# Patient Record
Sex: Female | Born: 1955 | Race: White | Hispanic: No | Smoking: Never smoker
Health system: Southern US, Community
[De-identification: ages and names within clinical notes are randomized; demographics above are authoritative.]

## PROBLEM LIST (undated history)

## (undated) DIAGNOSIS — E785 Hyperlipidemia, unspecified: Secondary | ICD-10-CM

## (undated) DIAGNOSIS — R251 Tremor, unspecified: Secondary | ICD-10-CM

## (undated) DIAGNOSIS — M8580 Other specified disorders of bone density and structure, unspecified site: Secondary | ICD-10-CM

## (undated) DIAGNOSIS — G2581 Restless legs syndrome: Secondary | ICD-10-CM

## (undated) DIAGNOSIS — L409 Psoriasis, unspecified: Secondary | ICD-10-CM

## (undated) DIAGNOSIS — G629 Polyneuropathy, unspecified: Secondary | ICD-10-CM

## (undated) DIAGNOSIS — K5140 Inflammatory polyps of colon without complications: Secondary | ICD-10-CM

## (undated) HISTORY — DX: Other specified disorders of bone density and structure, unspecified site: M85.80

## (undated) HISTORY — PX: CYST EXCISION: SHX5701

## (undated) HISTORY — DX: Polyneuropathy, unspecified: G62.9

## (undated) HISTORY — DX: Psoriasis, unspecified: L40.9

## (undated) HISTORY — DX: Restless legs syndrome: G25.81

## (undated) HISTORY — DX: Hyperlipidemia, unspecified: E78.5

## (undated) HISTORY — PX: TONSILLECTOMY AND ADENOIDECTOMY: SHX28

## (undated) HISTORY — PX: CHOLECYSTECTOMY: SHX55

## (undated) HISTORY — DX: Inflammatory polyps of colon without complications: K51.40

## (undated) HISTORY — DX: Tremor, unspecified: R25.1

---

## 2012-10-28 DIAGNOSIS — G629 Polyneuropathy, unspecified: Secondary | ICD-10-CM | POA: Insufficient documentation

## 2013-05-07 ENCOUNTER — Ambulatory Visit (INDEPENDENT_AMBULATORY_CARE_PROVIDER_SITE_OTHER): Payer: BC Managed Care – PPO | Admitting: Family Medicine

## 2013-05-07 ENCOUNTER — Encounter: Payer: Self-pay | Admitting: Family Medicine

## 2013-05-07 VITALS — BP 120/80 | HR 84 | Temp 98.3°F | Ht 64.25 in | Wt 141.8 lb

## 2013-05-07 DIAGNOSIS — E785 Hyperlipidemia, unspecified: Secondary | ICD-10-CM

## 2013-05-07 DIAGNOSIS — L408 Other psoriasis: Secondary | ICD-10-CM

## 2013-05-07 DIAGNOSIS — M8580 Other specified disorders of bone density and structure, unspecified site: Secondary | ICD-10-CM

## 2013-05-07 DIAGNOSIS — L409 Psoriasis, unspecified: Secondary | ICD-10-CM

## 2013-05-07 DIAGNOSIS — M899 Disorder of bone, unspecified: Secondary | ICD-10-CM

## 2013-05-07 DIAGNOSIS — Z1239 Encounter for other screening for malignant neoplasm of breast: Secondary | ICD-10-CM

## 2013-05-07 DIAGNOSIS — G2581 Restless legs syndrome: Secondary | ICD-10-CM

## 2013-05-07 NOTE — Progress Notes (Signed)
Nature conservation officer at [facility] [address] [place] [address] Phone: [phone] Fax: [phone]  Date:  05/07/2013   Name:  Tami Warner   DOB:  Nov 04, 1955   MRN:  [id number] Gender: female Age: 57 y.o.  Primary Physician:  Hannah Beat, MD  Evaluating MD: Hannah Beat, MD   Chief Complaint: New Patient   History of Present Illness:  Tami Warner is a 57 y.o. 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  HLD, labs reviewed Total Chol in the 210-220 range. No meds now.  Psoriasis stable right now, 1 patch on head  Osteopenia, difficulty remembering to take her ca and vit d  Has had prn restless legs at times  Intermittent elavil use for insomnia  O/w has been healthy  Had some increased IOP req frequent Opth checks  Patient Active Problem List   Diagnosis Date Noted  . Hyperlipidemia   . Restless leg syndrome   . Psoriasis   . Osteopenia     Past Medical History  Diagnosis Date  . Hyperlipidemia   . Inflammatory polyps of colon without complications   . Tremor   . Restless leg syndrome   . Peripheral neuropathy   . Psoriasis   . Osteopenia     Past Surgical History  Procedure Laterality Date  . Cholecystectomy    . Tonsillectomy and adenoidectomy      History   Social History  . Marital Status: Single    Spouse Name: N/A    Number of Children: 3  . Years of Education: N/A   Occupational History  . toxicologist     [employer]   Social History Main Topics  . Smoking status: Never Smoker   . Smokeless tobacco: Never Used  . Alcohol Use: Yes     Comment: occ glass of wine  . Drug Use: No  . Sexual Activity: Not on file   Other Topics Concern  . Not on file   Social History Narrative  . No narrative on file    Family History  Problem Relation Age of Onset  . Hyperlipidemia Mother   . Hypertension Mother   . Arthritis Mother   . Gout Mother   .  Hyperlipidemia Father   . Hypertension Father   . Tremor Father   . COPD Sister   . Asthma Sister   . Hyperlipidemia Sister   . Tremor Sister   . Hyperlipidemia Brother   . Alcohol abuse Son   . Drug abuse Son   . Breast cancer Maternal Aunt   . Hyperlipidemia Sister     No Known Allergies  No current outpatient prescriptions on file prior to visit.   No current facility-administered medications on file prior to visit.     Review of Systems:   GEN: No acute illnesses, no fevers, chills. GI: No n/v/d, eating normally Pulm: No SOB Interactive and getting along well at home.  Otherwise, ROS is as per the HPI.   Physical Examination: BP 120/80  Pulse 84  Temp(Src) 98.3 F (36.8 C) (Oral)  Ht 5' 4.25" (1.632 m)  Wt 141 lb 12 oz (64.297 kg)  BMI 24.14 kg/m2  Ideal Body Weight: Weight in (lb) to have BMI = 25: 146.5   GEN: WDWN, NAD, Non-toxic, A & O x 3 HEENT: Atraumatic, Normocephalic. Neck supple. No masses, No LAD. Ears and Nose: No external deformity. CV: RRR, No M/G/R. No JVD. No thrill. No extra  heart sounds. PULM: CTA B, no wheezes, crackles, rhonchi. No retractions. No resp. distress. No accessory muscle use. EXTR: No c/c/e NEURO Normal gait.  PSYCH: Normally interactive. Conversant. Not depressed or anxious appearing.  Calm demeanor.    Assessment and Plan:  Encounter for breast cancer screening other than mammogram - Plan: MM Digital Screening  Hyperlipidemia  Restless leg syndrome  Psoriasis  Osteopenia  We will obtain records from the patient's prior physicians. Reviewed and all labs reviewed. With patient. Mildly low d and HLD.  Work on your diet: Try to eat minimal fatty food and eat more fruits and vegetables. Anything fried is bad, cream, beef and pork fat are bad.  Exercise is incredibly important to heart health. Try to exercise for at least 30 minutes 5 - 6 times a week   O/w doing well.  Orders Today:  Orders Placed This  Encounter  Procedures  . MM Digital Screening    Standing Status: Future     Number of Occurrences:      Standing Expiration Date: 07/07/2014    Order Specific Question:  Is the patient pregnant?    Answer:  No    Order Specific Question:  Preferred imaging location?    Answer:  [facility]    Order Specific Question:  Reason for exam:    Answer:  screening    Updated Medication List: (Includes new medications, updates to list, dose adjustments) No orders of the defined types were placed in this encounter.    Medications Discontinued: There are no discontinued medications.    Signed, Elpidio Galea. Alyanna Stoermer, MD 05/07/2013 2:20 PM

## 2013-05-07 NOTE — Patient Instructions (Addendum)
[facility]: Babs Sciara at [facility]  Opthalmology: Antony Contras at [facility] Dr. Dione Booze at [facility]  REFERRAL: GO THE THE FRONT ROOM AT THE ENTRANCE OF OUR CLINIC, NEAR CHECK IN. ASK FOR MARION. SHE WILL HELP YOU SET UP YOUR REFERRAL. DATE: TIME:

## 2013-05-08 DIAGNOSIS — L409 Psoriasis, unspecified: Secondary | ICD-10-CM | POA: Insufficient documentation

## 2013-05-08 DIAGNOSIS — M8580 Other specified disorders of bone density and structure, unspecified site: Secondary | ICD-10-CM | POA: Insufficient documentation

## 2013-05-08 DIAGNOSIS — G2581 Restless legs syndrome: Secondary | ICD-10-CM | POA: Insufficient documentation

## 2013-05-08 DIAGNOSIS — E785 Hyperlipidemia, unspecified: Secondary | ICD-10-CM | POA: Insufficient documentation

## 2013-05-29 ENCOUNTER — Ambulatory Visit
Admission: RE | Admit: 2013-05-29 | Discharge: 2013-05-29 | Disposition: A | Discharge status: Home / Self Care | Payer: BC Managed Care – PPO | Source: Ambulatory Visit | Attending: Family Medicine | Admitting: Family Medicine

## 2013-05-29 DIAGNOSIS — Z1239 Encounter for other screening for malignant neoplasm of breast: Secondary | ICD-10-CM

## 2013-11-29 ENCOUNTER — Ambulatory Visit (INDEPENDENT_AMBULATORY_CARE_PROVIDER_SITE_OTHER): Payer: BC Managed Care – PPO | Admitting: Internal Medicine

## 2013-11-29 ENCOUNTER — Encounter: Payer: Self-pay | Admitting: Internal Medicine

## 2013-11-29 VITALS — BP 120/76 | HR 74 | Temp 98.1°F | Wt 142.5 lb

## 2013-11-29 DIAGNOSIS — M25476 Effusion, unspecified foot: Secondary | ICD-10-CM

## 2013-11-29 DIAGNOSIS — M109 Gout, unspecified: Secondary | ICD-10-CM

## 2013-11-29 DIAGNOSIS — M25473 Effusion, unspecified ankle: Secondary | ICD-10-CM

## 2013-11-29 LAB — CBC
HEMATOCRIT: 42 % (ref 36.0–46.0)
HEMOGLOBIN: 14.3 g/dL (ref 12.0–15.0)
MCHC: 34 g/dL (ref 30.0–36.0)
MCV: 92.1 fl (ref 78.0–100.0)
Platelets: 251 10*3/uL (ref 150.0–400.0)
RBC: 4.56 Mil/uL (ref 3.87–5.11)
RDW: 11.9 % (ref 11.5–14.6)
WBC: 6.1 10*3/uL (ref 4.5–10.5)

## 2013-11-29 LAB — COMPREHENSIVE METABOLIC PANEL
ALK PHOS: 51 U/L (ref 39–117)
ALT: 25 U/L (ref 0–35)
AST: 19 U/L (ref 0–37)
Albumin: 4.3 g/dL (ref 3.5–5.2)
BILIRUBIN TOTAL: 0.8 mg/dL (ref 0.3–1.2)
BUN: 13 mg/dL (ref 6–23)
CHLORIDE: 103 meq/L (ref 96–112)
CO2: 31 mEq/L (ref 19–32)
CREATININE: 0.6 mg/dL (ref 0.4–1.2)
Calcium: 9.1 mg/dL (ref 8.4–10.5)
GFR: 108.37 mL/min (ref >60.00)
Glucose, Bld: 81 mg/dL (ref 70–99)
Potassium: 3.6 mEq/L (ref 3.5–5.1)
Sodium: 141 mEq/L (ref 135–145)
Total Protein: 7.2 g/dL (ref 6.0–8.3)

## 2013-11-29 LAB — URIC ACID: Uric Acid, Serum: 3.5 mg/dL (ref 2.4–7.0)

## 2013-11-29 NOTE — Progress Notes (Signed)
   Subjective:    Patient ID: Tami Warner, female    DOB: [birth year], 58 y.o.   MRN: [id number]  HPI  Pt presents to the clinic today with c/o pain and swelling in the ball of her foot. She noticed this. She reports it is also red, swollen, hot and tender to touch. She has tried OTC ibuprofen and ice compresses with some relief. She has no history of gout. She does occasionally drink wine, only a little red meat.  Review of Systems      Past Medical History  Diagnosis Date  . Hyperlipidemia   . Inflammatory polyps of colon without complications   . Tremor   . Restless leg syndrome   . Peripheral neuropathy   . Psoriasis   . Osteopenia     No current outpatient prescriptions on file.   No current facility-administered medications for this visit.    No Known Allergies  Family History  Problem Relation Age of Onset  . Hyperlipidemia Mother   . Hypertension Mother   . Arthritis Mother   . Gout Mother   . Hyperlipidemia Father   . Hypertension Father   . Tremor Father   . COPD Sister   . Asthma Sister   . Hyperlipidemia Sister   . Tremor Sister   . Hyperlipidemia Brother   . Alcohol abuse Son   . Drug abuse Son   . Breast cancer Maternal Aunt   . Hyperlipidemia Sister     History   Social History  . Marital Status: Single    Spouse Name: N/A    Number of Children: 3  . Years of Education: N/A   Occupational History  . toxicologist     [employer]   Social History Main Topics  . Smoking status: Never Smoker   . Smokeless tobacco: Never Used  . Alcohol Use: Yes     Comment: occ glass of wine  . Drug Use: No  . Sexual Activity: Not on file   Other Topics Concern  . Not on file   Social History Narrative  . No narrative on file     Constitutional: Denies fever, malaise, fatigue, headache or abrupt weight changes.  Musculoskeletal: Pt reports pain and swelling in the ball of her foot. Denies decrease in range of motion, difficulty with gait, muscle  pain.  Skin: Pt reports redness and warmth on ball of foot. Denies rashes, lesions or ulcercations.     No other specific complaints in a complete review of systems (except as listed in HPI above).  Objective:   Physical Exam  BP 120/76  Pulse 74  Temp(Src) 98.1 F (36.7 C) (Oral)  Wt 142 lb 8 oz (64.638 kg)  SpO2 98% Wt Readings from Last 3 Encounters:  11/29/13 142 lb 8 oz (64.638 kg)  05/07/13 141 lb 12 oz (64.297 kg)    General: Appears their stated age, well developed, well nourished in NAD. Skin: Warm, dry and intact. No rashes, lesions or ulcerations noted. Musculoskeletal: Swelling of PIP left big toe. Warm and tender to touch.          Assessment & Plan:   Gout:  Will check cbc, cmet and uric acid level today Continue Ibuprofen for now as flare seems to be improved  RTC as needed or if symptoms persist or worsen

## 2013-11-29 NOTE — Patient Instructions (Addendum)

## 2013-11-29 NOTE — Progress Notes (Signed)
Pre visit review using our clinic review tool, if applicable. No additional management support is needed unless otherwise documented below in the visit note. 

## 2013-12-03 ENCOUNTER — Encounter: Payer: Self-pay | Admitting: Family Medicine

## 2014-02-01 ENCOUNTER — Other Ambulatory Visit: Payer: Self-pay | Admitting: Family Medicine

## 2014-02-01 ENCOUNTER — Telehealth: Payer: Self-pay

## 2014-02-01 MED ORDER — AMITRIPTYLINE HCL 25 MG PO TABS: 25.0000 mg | ORAL_TABLET | Freq: Every evening | ORAL | Status: DC | PRN

## 2014-02-01 NOTE — Telephone Encounter (Signed)
Pt request amitriptyline 25 mg with instructions taking 1-2 tabs at hs prn; med given to pt by previous physician, Dr Manson PasseyBrown. This is the only med pt has taken for anxiety and restless leg and pt does not take very often. 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 Pt said she does not take a lot of meds but hopes can get amitriptyline. Pt said if she needs to come for appt she will.pt request cb.[facility].

## 2014-03-27 ENCOUNTER — Encounter: Payer: Self-pay | Admitting: Family Medicine

## 2014-03-27 ENCOUNTER — Ambulatory Visit (INDEPENDENT_AMBULATORY_CARE_PROVIDER_SITE_OTHER): Payer: BC Managed Care – PPO | Admitting: Family Medicine

## 2014-03-27 VITALS — BP 110/70 | HR 70 | Temp 97.9°F | Ht 64.25 in | Wt 143.5 lb

## 2014-03-27 DIAGNOSIS — H8110 Benign paroxysmal vertigo, unspecified ear: Secondary | ICD-10-CM

## 2014-03-27 DIAGNOSIS — H8111 Benign paroxysmal vertigo, right ear: Secondary | ICD-10-CM

## 2014-03-27 MED ORDER — MECLIZINE HCL 25 MG PO TABS
12.5000 mg | ORAL_TABLET | Freq: Three times a day (TID) | ORAL | Status: DC | PRN
Start: 2014-03-27 — End: 2014-08-13

## 2014-03-27 MED ORDER — DIAZEPAM 2 MG PO TABS: 2.0000 mg | ORAL_TABLET | Freq: Four times a day (QID) | ORAL | Status: DC | PRN

## 2014-03-27 NOTE — Progress Notes (Signed)
[address] [place] [address]                Phone: [phone]                  Fax: [phone] 03/27/2014  ID: Minette Manders   MRN: [id number]  DOB: [birth year]  Primary Physician:  Hannah Beat, MD  Chief Complaint: Dizziness and Nausea  Subjective:   History of Present Illness:  Tami Warner is a 58 y.o. very pleasant female patient who presents with the following:  A few years ago, had some vertigo. Thought was getting up to quickly. Trouble sitting this morning, beyond dizzy. World is spinning around her.  Worse with going to the right. Nausea+. No ha, trauma, or neuro sx  Phenergan x 1 and it helped some. Business trip this weekend.   Past Medical History, Surgical History, Social History, Family History, Problem List, Medications, and Allergies have been reviewed and updated if relevant.  Review of Systems:  GEN: No acute illnesses, no fevers, chills. GI: No n/v/d, eating normally Pulm: No SOB Interactive and getting along well at home.  Otherwise, ROS is as per the HPI.  Objective:   Physical Examination: BP 110/70  Pulse 70  Temp(Src) 97.9 F (36.6 C) (Oral)  Ht 5' 4.25" (1.632 m)  Wt 143 lb 8 oz (65.091 kg)  BMI 24.44 kg/m2   GEN: WDWN, NAD, Non-toxic, A & O x 3 HEENT: Atraumatic, Normocephalic. Neck supple. No masses, No LAD. Ears and Nose: No external deformity. CV: RRR, No M/G/R. No JVD. No thrill. No extra heart sounds. PULM: CTA B, no wheezes, crackles, rhonchi. No retractions. No resp. distress. No accessory muscle use. EXTR: No c/c/e NEURO Normal gait.  PSYCH: Normally interactive. Conversant. Not depressed or anxious appearing.  Calm demeanor.   Laboratory and Imaging Data:  Assessment & Plan:   BPPV (benign paroxysmal positional vertigo), right  Treat symptomatically. If the patient is not better performance, consideration of ENT involvement appropriate.  New Prescriptions   DIAZEPAM (VALIUM) 2 MG  TABLET    Take 1 tablet (2 mg total) by mouth every 6 (six) hours as needed (vertigo).   MECLIZINE (ANTIVERT) 25 MG TABLET    Take 0.5-1 tablets (12.5-25 mg total) by mouth 3 (three) times daily as needed for dizziness.   Patient Instructions  Epley Maneuver Self-Care WHAT IS THE EPLEY MANEUVER? The Epley maneuver is an exercise you can do to relieve symptoms of benign paroxysmal positional vertigo (BPPV). This condition is often just referred to as vertigo. BPPV is caused by the movement of tiny crystals (canaliths) inside your inner ear. The accumulation and movement of canaliths in your inner ear causes a sudden spinning sensation (vertigo) when you move your head to certain positions. Vertigo usually lasts about 30 seconds. BPPV usually occurs in just one ear. If you get vertigo when you lie on your left side, you probably have BPPV in your left ear. Your health care provider can tell you which ear is involved.  BPPV may be caused by a head injury. Many people older than 50 get BPPV for unknown reasons. If you have been diagnosed with BPPV, your health care provider may teach you how to do this maneuver. BPPV is not life threatening (benign) and usually goes away in time.  WHEN SHOULD I PERFORM THE EPLEY  MANEUVER? You can do this maneuver at home whenever you have symptoms of vertigo. You may do the Epley maneuver up to 3 times a day until your symptoms of vertigo go away. HOW SHOULD I DO THE EPLEY MANEUVER? 1. Sit on the edge of a bed or table with your back straight. Your legs should be extended or hanging over the edge of the bed or table.  2. Turn your head halfway toward the affected ear.  3. Lie backward quickly with your head turned until you are lying flat on your back. You may want to position a pillow under your shoulders.  4. Hold this position for 30 seconds. You may experience an attack of vertigo. This is normal. Hold this position until the vertigo stops. 5. Then turn your head  to the opposite direction until your unaffected ear is facing the floor.  6. Hold this position for 30 seconds. You may experience an attack of vertigo. This is normal. Hold this position until the vertigo stops. 7. Now turn your whole body to the same side as your head. Hold for another 30 seconds.  8. You can then sit back up. ARE THERE RISKS TO THIS MANEUVER? In some cases, you may have other symptoms (such as changes in your vision, weakness, or numbness). If you have these symptoms, stop doing the maneuver and call your health care provider. Even if doing these maneuvers relieves your vertigo, you may still have dizziness. Dizziness is the sensation of light-headedness but without the sensation of movement. Even though the Epley maneuver may relieve your vertigo, it is possible that your symptoms will return within 5 years. WHAT SHOULD I DO AFTER THIS MANEUVER? After doing the Epley maneuver, you can return to your normal activities. Ask your doctor if there is anything you should do at home to prevent vertigo. This may include:  Sleeping with two or more pillows to keep your head elevated.  Not sleeping on the side of your affected ear.  Getting up slowly from bed.  Avoiding sudden movements during the day.  Avoiding extreme head movement, like looking up or bending over.  Wearing a cervical collar to prevent sudden head movements. WHAT SHOULD I DO IF MY SYMPTOMS GET WORSE? Call your health care provider if your vertigo gets worse. Call your provider right way if you have other symptoms, including:   Nausea.  Vomiting.  Headache.  Weakness.  Numbness.  Vision changes. Document Released: 08/14/2013 Document Reviewed: 08/14/2013 [facility] Patient Information 2015 [place]Care, [place]. This information is not intended to replace advice given to you by your health care provider. Make sure you discuss any questions you have with your health care provider.      Signed,  Elpidio GaleaSpencer  T. Keysha Damewood, MD, CAQ Sports Medicine  Current Medications at Discharge:   Medication List       This list is accurate as of: 03/27/14  1:33 PM.  Always use your most recent med list.               amitriptyline 25 MG tablet  Commonly known as:  ELAVIL  Take 1-2 tablets (25-50 mg total) by mouth at bedtime as needed for sleep.     diazepam 2 MG tablet  Commonly known as:  VALIUM  Take 1 tablet (2 mg total) by mouth every 6 (six) hours as needed (vertigo).     meclizine 25 MG tablet  Commonly known as:  ANTIVERT  Take 0.5-1 tablets (12.5-25 mg total) by  mouth 3 (three) times daily as needed for dizziness.

## 2014-03-27 NOTE — Patient Instructions (Signed)
Epley Maneuver Self-Care WHAT IS THE EPLEY MANEUVER? The Epley maneuver is an exercise you can do to relieve symptoms of benign paroxysmal positional vertigo (BPPV). This condition is often just referred to as vertigo. BPPV is caused by the movement of tiny crystals (canaliths) inside your inner ear. The accumulation and movement of canaliths in your inner ear causes a sudden spinning sensation (vertigo) when you move your head to certain positions. Vertigo usually lasts about 30 seconds. BPPV usually occurs in just one ear. If you get vertigo when you lie on your left side, you probably have BPPV in your left ear. Your health care provider can tell you which ear is involved.  BPPV may be caused by a head injury. Many people older than 50 get BPPV for unknown reasons. If you have been diagnosed with BPPV, your health care provider may teach you how to do this maneuver. BPPV is not life threatening (benign) and usually goes away in time.  WHEN SHOULD I PERFORM THE EPLEY MANEUVER? You can do this maneuver at home whenever you have symptoms of vertigo. You may do the Epley maneuver up to 3 times a day until your symptoms of vertigo go away. HOW SHOULD I DO THE EPLEY MANEUVER? 1. Sit on the edge of a bed or table with your back straight. Your legs should be extended or hanging over the edge of the bed or table.  2. Turn your head halfway toward the affected ear.  3. Lie backward quickly with your head turned until you are lying flat on your back. You may want to position a pillow under your shoulders.  4. Hold this position for 30 seconds. You may experience an attack of vertigo. This is normal. Hold this position until the vertigo stops. 5. Then turn your head to the opposite direction until your unaffected ear is facing the floor.  6. Hold this position for 30 seconds. You may experience an attack of vertigo. This is normal. Hold this position until the vertigo stops. 7. Now turn your whole body to  the same side as your head. Hold for another 30 seconds.  8. You can then sit back up. ARE THERE RISKS TO THIS MANEUVER? In some cases, you may have other symptoms (such as changes in your vision, weakness, or numbness). If you have these symptoms, stop doing the maneuver and call your health care provider. Even if doing these maneuvers relieves your vertigo, you may still have dizziness. Dizziness is the sensation of light-headedness but without the sensation of movement. Even though the Epley maneuver may relieve your vertigo, it is possible that your symptoms will return within 5 years. WHAT SHOULD I DO AFTER THIS MANEUVER? After doing the Epley maneuver, you can return to your normal activities. Ask your doctor if there is anything you should do at home to prevent vertigo. This may include:  Sleeping with two or more pillows to keep your head elevated.  Not sleeping on the side of your affected ear.  Getting up slowly from bed.  Avoiding sudden movements during the day.  Avoiding extreme head movement, like looking up or bending over.  Wearing a cervical collar to prevent sudden head movements. WHAT SHOULD I DO IF MY SYMPTOMS GET WORSE? Call your health care provider if your vertigo gets worse. Call your provider right way if you have other symptoms, including:   Nausea.  Vomiting.  Headache.  Weakness.  Numbness.  Vision changes. Document Released: 08/14/2013 Document Reviewed: 08/14/2013 ExitCare   Patient Information 2015 ExitCare, LLC. This information is not intended to replace advice given to you by your health care provider. Make sure you discuss any questions you have with your health care provider.  

## 2014-03-27 NOTE — Progress Notes (Signed)
Pre visit review using our clinic review tool, if applicable. No additional management support is needed unless otherwise documented below in the visit note. 

## 2014-03-28 ENCOUNTER — Telehealth: Payer: Self-pay

## 2014-03-28 NOTE — Telephone Encounter (Signed)
Pt request copy of letter for work done on 03/27/14. Advised pt copy of letter at front desk for pick up.

## 2014-08-12 ENCOUNTER — Telehealth: Payer: Self-pay | Admitting: Family Medicine

## 2014-08-12 NOTE — Telephone Encounter (Signed)
Pt came in today to request to be seen today. We did not have any visits available and the other offices were checked, but no appts available. Pt was advised that if she began having a lot of pain, to go to urgent care and she agreed. Otherwise she has an appt tomorrow @ 1:45pm

## 2014-08-13 ENCOUNTER — Encounter: Payer: Self-pay | Admitting: Family Medicine

## 2014-08-13 ENCOUNTER — Ambulatory Visit (INDEPENDENT_AMBULATORY_CARE_PROVIDER_SITE_OTHER): Payer: BC Managed Care – PPO | Admitting: Family Medicine

## 2014-08-13 VITALS — BP 120/80 | HR 75 | Temp 98.3°F | Ht 64.25 in | Wt 147.2 lb

## 2014-08-13 DIAGNOSIS — R3915 Urgency of urination: Secondary | ICD-10-CM

## 2014-08-13 DIAGNOSIS — N3001 Acute cystitis with hematuria: Secondary | ICD-10-CM

## 2014-08-13 DIAGNOSIS — N390 Urinary tract infection, site not specified: Secondary | ICD-10-CM | POA: Insufficient documentation

## 2014-08-13 LAB — POCT UA - MICROSCOPIC ONLY

## 2014-08-13 LAB — POCT URINALYSIS DIPSTICK
Bilirubin, UA: NEGATIVE
Glucose, UA: NEGATIVE
Ketones, UA: NEGATIVE
NITRITE UA: NEGATIVE
PH UA: 7
SPEC GRAV UA: 1.02
UROBILINOGEN UA: 0.2

## 2014-08-13 MED ORDER — SULFAMETHOXAZOLE-TRIMETHOPRIM 800-160 MG PO TABS: 1.0000 | ORAL_TABLET | Freq: Two times a day (BID) | ORAL | Status: DC

## 2014-08-13 NOTE — Assessment & Plan Note (Signed)
Uncomplicated. Send urine for culture.  treat with sulfa tmp twice daily x 3 days.

## 2014-08-13 NOTE — Addendum Note (Signed)
Addended by: Damita LackLORING, Bona Hubbard S on: 08/13/2014 02:36 PM   Modules accepted: Orders

## 2014-08-13 NOTE — Patient Instructions (Signed)
Complete antibiotics, push fluids.  Call if not improving as expected.   Urinary Tract Infection Urinary tract infections (UTIs) can develop anywhere along your urinary tract. Your urinary tract is your body's drainage system for removing wastes and extra water. Your urinary tract includes two kidneys, two ureters, a bladder, and a urethra. Your kidneys are a pair of bean-shaped organs. Each kidney is about the size of your fist. They are located below your ribs, one on each side of your spine. CAUSES Infections are caused by microbes, which are microscopic organisms, including fungi, viruses, and bacteria. These organisms are so small that they can only be seen through a microscope. Bacteria are the microbes that most commonly cause UTIs. SYMPTOMS  Symptoms of UTIs may vary by age and gender of the patient and by the location of the infection. Symptoms in young women typically include a frequent and intense urge to urinate and a painful, burning feeling in the bladder or urethra during urination. Older women and men are more likely to be tired, shaky, and weak and have muscle aches and abdominal pain. A fever may mean the infection is in your kidneys. Other symptoms of a kidney infection include pain in your back or sides below the ribs, nausea, and vomiting. DIAGNOSIS To diagnose a UTI, your caregiver will ask you about your symptoms. Your caregiver also will ask to provide a urine sample. The urine sample will be tested for bacteria and white blood cells. White blood cells are made by your body to help fight infection. TREATMENT  Typically, UTIs can be treated with medication. Because most UTIs are caused by a bacterial infection, they usually can be treated with the use of antibiotics. The choice of antibiotic and length of treatment depend on your symptoms and the type of bacteria causing your infection. HOME CARE INSTRUCTIONS  If you were prescribed antibiotics, take them exactly as your  caregiver instructs you. Finish the medication even if you feel better after you have only taken some of the medication.  Drink enough water and fluids to keep your urine clear or pale yellow.  Avoid caffeine, tea, and carbonated beverages. They tend to irritate your bladder.  Empty your bladder often. Avoid holding urine for long periods of time.  Empty your bladder before and after sexual intercourse.  After a bowel movement, women should cleanse from front to back. Use each tissue only once. SEEK MEDICAL CARE IF:   You have back pain.  You develop a fever.  Your symptoms do not begin to resolve within 3 days. SEEK IMMEDIATE MEDICAL CARE IF:   You have severe back pain or lower abdominal pain.  You develop chills.  You have nausea or vomiting.  You have continued burning or discomfort with urination. MAKE SURE YOU:   Understand these instructions.  Will watch your condition.  Will get help right away if you are not doing well or get worse. Document Released: 05/19/2005 Document Revised: 02/08/2012 Document Reviewed: 09/17/2011 [facility]ExitCare Patient Information 2015 [place]Care, [place]LLC. This information is not intended to replace advice given to you by your health care provider. Make sure you discuss any questions you have with your health care provider.

## 2014-08-13 NOTE — Progress Notes (Signed)
Pre visit review using our clinic review tool, if applicable. No additional management support is needed unless otherwise documented below in the visit note. 

## 2014-08-13 NOTE — Progress Notes (Signed)
   Subjective:    Patient ID: Tami Warner, female    DOB: 1955/08/31, 58 y.o.   MRN: [id number]  HPI  58 year old  Post menopausal female pt of Dr. Cyndie Chimeopland's reports  5 days of lower abdominal achyness, pale pink urine.  Spotting of blood on toilet tissues.  She has noted urinary urgency, frequency,  sharp spasm of pain at end of urine stream.  No vaginal discharge or itching.   No fever, no flank pain.  No history recurrent UTIs, no recent uro procedures, has two kidneys  No past pyelo.     Review of Systems  Constitutional: Negative for fever and fatigue.  HENT: Negative for ear pain.   Eyes: Negative for pain.  Respiratory: Negative for chest tightness and shortness of breath.   Cardiovascular: Negative for chest pain, palpitations and leg swelling.  Gastrointestinal: Negative for abdominal pain.  Genitourinary: Negative for dysuria.       Objective:   Physical Exam  Constitutional: Vital signs are normal. She appears well-developed and well-nourished. She is cooperative.  Non-toxic appearance. She does not appear ill. No distress.  HENT:  Head: Normocephalic.  Right Ear: Hearing, tympanic membrane, external ear and ear canal normal. Tympanic membrane is not erythematous, not retracted and not bulging.  Left Ear: Hearing, tympanic membrane, external ear and ear canal normal. Tympanic membrane is not erythematous, not retracted and not bulging.  Nose: No mucosal edema or rhinorrhea. Right sinus exhibits no maxillary sinus tenderness and no frontal sinus tenderness. Left sinus exhibits no maxillary sinus tenderness and no frontal sinus tenderness.  Mouth/Throat: Uvula is midline, oropharynx is clear and moist and mucous membranes are normal.  Eyes: Conjunctivae, EOM and lids are normal. Pupils are equal, round, and reactive to light. Lids are everted and swept, no foreign bodies found.  Neck: Trachea normal and normal range of motion. Neck supple. Carotid bruit is not  present. No thyroid mass and no thyromegaly present.  Cardiovascular: Normal rate, regular rhythm, S1 normal, S2 normal, normal heart sounds, intact distal pulses and normal pulses.  Exam reveals no gallop and no friction rub.   No murmur heard. Pulmonary/Chest: Effort normal and breath sounds normal. No tachypnea. No respiratory distress. She has no decreased breath sounds. She has no wheezes. She has no rhonchi. She has no rales.  Abdominal: Soft. Normal appearance and bowel sounds are normal. There is tenderness in the suprapubic area. There is no rigidity, no guarding and no CVA tenderness.  Neurological: She is alert.  Skin: Skin is warm, dry and intact. No rash noted.  Psychiatric: Her speech is normal and behavior is normal. Judgment and thought content normal. Her mood appears not anxious. Cognition and memory are normal. She does not exhibit a depressed mood.          Assessment & Plan:

## 2014-08-15 LAB — URINE CULTURE

## 2014-08-19 ENCOUNTER — Telehealth: Payer: Self-pay | Admitting: Family Medicine

## 2014-08-19 MED ORDER — CIPROFLOXACIN HCL 250 MG PO TABS: 250.0000 mg | ORAL_TABLET | Freq: Two times a day (BID) | ORAL | Status: DC

## 2014-08-19 NOTE — Telephone Encounter (Signed)
Notify pt that the bacteria in her urine has returned as Ecoli RESISTANT to the antibiotic she is on. I will send in a course of copiro for her to complete.

## 2014-08-19 NOTE — Telephone Encounter (Signed)
Patient notified and verbalized understanding. 

## 2019-05-08 ENCOUNTER — Telehealth: Payer: Self-pay | Admitting: Family Medicine

## 2019-05-08 NOTE — Telephone Encounter (Signed)
I called and left message on patient voicemail to cal office and reschedule appointment scheduled for 05/09/2019, Dr. Loletha Grayer is not in the office on 05/08/2019 and appointment needs to be rescheduled.

## 2019-05-09 ENCOUNTER — Ambulatory Visit: Payer: Self-pay | Admitting: Family Medicine

## 2019-05-17 ENCOUNTER — Encounter: Payer: Self-pay | Admitting: Family Medicine

## 2019-05-17 ENCOUNTER — Ambulatory Visit (INDEPENDENT_AMBULATORY_CARE_PROVIDER_SITE_OTHER): Payer: Medicare Other | Admitting: Family Medicine

## 2019-05-17 ENCOUNTER — Other Ambulatory Visit: Payer: Self-pay

## 2019-05-17 VITALS — BP 140/84 | HR 74 | Temp 98.2°F | Ht 64.25 in | Wt 148.6 lb

## 2019-05-17 DIAGNOSIS — Z23 Encounter for immunization: Secondary | ICD-10-CM

## 2019-05-17 DIAGNOSIS — F4321 Adjustment disorder with depressed mood: Secondary | ICD-10-CM

## 2019-05-17 DIAGNOSIS — Z7689 Persons encountering health services in other specified circumstances: Secondary | ICD-10-CM | POA: Diagnosis not present

## 2019-05-17 DIAGNOSIS — F418 Other specified anxiety disorders: Secondary | ICD-10-CM

## 2019-05-17 MED ORDER — SERTRALINE HCL 50 MG PO TABS: 50.0000 mg | ORAL_TABLET | Freq: Every day | ORAL | 3 refills | Status: DC

## 2019-05-17 MED ORDER — ALPRAZOLAM 0.25 MG PO TABS: 0.2500 mg | ORAL_TABLET | Freq: Two times a day (BID) | ORAL | 0 refills | Status: DC | PRN

## 2019-05-17 NOTE — Progress Notes (Signed)
Tami Warner is a 63 y.o. female  Chief Complaint  Patient presents with  . Establish Care    est care/ anxiety & depression consult/ wantsflu and pneumonia    HPI: Tami Warner is a 63 y.o. female here to establish care with our office. She has 3 grown children.  She would like flu vaccine and would also like pneumonia vaccine. She would like to talk about situational depression and trouble sleeping. Her mother is in end stage renal failure with GFR = 8. Mother is not on dialysis. Pt was told mother will likely pass away in the next 2 weeks. Pt will be able to visit her mother tomorrow along with pts brother and sister. Her father has advanced dementia and is also in ALF.  Pt is having trouble falling and staying asleep. She is snacking a lot. She does not feel motivated to do anything. She is showering, dressing, etc.  Depression screen PHQ 2/9 05/17/2019  Decreased Interest 2  Down, Depressed, Hopeless 1  PHQ - 2 Score 3  Altered sleeping 1  Tired, decreased energy 2  Change in appetite 2  Feeling bad or failure about yourself  0  Trouble concentrating 0  Moving slowly or fidgety/restless 0  Suicidal thoughts 0  PHQ-9 Score 8    GAD 7 : Generalized Anxiety Score 05/17/2019  Nervous, Anxious, on Edge 2  Control/stop worrying 3  Worry too much - different things 1  Trouble relaxing 0  Restless 0  Easily annoyed or irritable 0  Afraid - awful might happen 1  Total GAD 7 Score 7    Last PAP: 2019 - normal Last mammo: 05/2018 Last Dexa: unsure - dx of osteopenia Last colonoscopy: 5 years ago  Past Medical History:  Diagnosis Date  . Hyperlipidemia   . Inflammatory polyps of colon without complications (HCC)   . Osteopenia   . Peripheral neuropathy   . Psoriasis   . Restless leg syndrome   . Tremor     Past Surgical History:  Procedure Laterality Date  . CHOLECYSTECTOMY    . CYST EXCISION    . TONSILLECTOMY AND ADENOIDECTOMY      Social History    Socioeconomic History  . Marital status: Single    Spouse name: Not on file  . Number of children: 3  . Years of education: Not on file  . Highest education level: Not on file  Occupational History  . Occupation: toxicologist    Comment: [employer]  Social Needs  . Financial resource strain: Not on file  . Food insecurity    Worry: Not on file    Inability: Not on file  . Transportation needs    Medical: Not on file    Non-medical: Not on file  Tobacco Use  . Smoking status: Never Smoker  . Smokeless tobacco: Never Used  Substance and Sexual Activity  . Alcohol use: Yes    Comment: occ glass of wine  . Drug use: No  . Sexual activity: Not on file  Lifestyle  . Physical activity    Days per week: Not on file    Minutes per session: Not on file  . Stress: Not on file  Relationships  . Social Musician on phone: Not on file    Gets together: Not on file    Attends religious service: Not on file    Active member of club or organization: Not on file    Attends meetings of clubs  or organizations: Not on file    Relationship status: Not on file  . Intimate partner violence    Fear of current or ex partner: Not on file    Emotionally abused: Not on file    Physically abused: Not on file    Forced sexual activity: Not on file  Other Topics Concern  . Not on file  Social History Narrative  . Not on file    Family History  Problem Relation Age of Onset  . Hyperlipidemia Mother   . Hypertension Mother   . Arthritis Mother   . Gout Mother   . Hyperlipidemia Father   . Hypertension Father   . Tremor Father   . COPD Sister   . Asthma Sister   . Hyperlipidemia Sister   . Tremor Sister   . Hyperlipidemia Brother   . Alcohol abuse Son   . Drug abuse Son   . Breast cancer Maternal Aunt   . Hyperlipidemia Sister      Immunization History  Administered Date(s) Administered  . Fluad Quad(high Dose 65+) 05/17/2019  . Influenza-Unspecified 06/08/2012  .  Pneumococcal Conjugate-13 05/17/2019  . Tdap 09/06/2008  . Zoster 12/22/2011    Outpatient Encounter Medications as of 05/17/2019  Medication Sig Note  . Cholecalciferol (VITAMIN D-3) 25 MCG (1000 UT) CAPS Take 2,000 Units by mouth.   . fluticasone (CUTIVATE) 0.05 % cream  08/13/2014: Received from: External Pharmacy  . Multiple Vitamin (MULTIVITAMIN) tablet Take 1 tablet by mouth daily.   . Omega-3 Fatty Acids (FISH OIL) 1000 MG CAPS Take by mouth.   . Probiotic Product (PROBIOTIC-10 PO) Take by mouth.   . ALPRAZolam (XANAX) 0.25 MG tablet Take 1 tablet (0.25 mg total) by mouth 2 (two) times daily as needed for anxiety.   . sertraline (ZOLOFT) 50 MG tablet Take 1 tablet (50 mg total) by mouth daily.   . [DISCONTINUED] amitriptyline (ELAVIL) 25 MG tablet Take 1-2 tablets (25-50 mg total) by mouth at bedtime as needed for sleep. (Patient not taking: Reported on 05/17/2019)   . [DISCONTINUED] ciprofloxacin (CIPRO) 250 MG tablet Take 1 tablet (250 mg total) by mouth 2 (two) times daily.   . [DISCONTINUED] clobetasol cream (TEMOVATE) 0.05 %  08/13/2014: Received from: External Pharmacy   No facility-administered encounter medications on file as of 05/17/2019.      ROS: Gen: no fever, chills  Skin: no rash, itching ENT: no ear pain, ear drainage, nasal congestion, rhinorrhea, sinus pressure, sore throat Eyes: no blurry vision, double vision Resp: no cough, wheeze,SOB CV: no CP, palpitations, LE edema,  GI: no heartburn, n/v/d/c, abd pain GU: no dysuria, urgency, frequency, hematuria  MSK: no joint pain, myalgias, back pain Neuro: no dizziness, headache, weakness, vertigo Psych: as above   No Known Allergies  BP 140/84   Pulse 74   Temp 98.2 F (36.8 C) (Oral)   Ht 5' 4.25" (1.632 m)   Wt 148 lb 9.6 oz (67.4 kg)   SpO2 96%   BMI 25.31 kg/m   BP Readings from Last 3 Encounters:  05/17/19 140/84  08/13/14 120/80  03/27/14 110/70    Physical Exam  Constitutional: She is  oriented to person, place, and time. She appears well-developed and well-nourished. No distress.  Cardiovascular: Normal rate, regular rhythm and normal heart sounds.  Pulmonary/Chest: Effort normal and breath sounds normal. No respiratory distress.  Neurological: She is alert and oriented to person, place, and time.  Psychiatric: She has a normal mood and affect. Her  behavior is normal.     A/P:  1. Encounter to establish care with new doctor  2. Need for influenza vaccination - Flu Vaccine QUAD High Dose(Fluad)  3. Need for pneumococcal vaccination - Pneumococcal conjugate vaccine 13-valent IM - PCV 23 in 1 year 04/2020  4. Situational depression 5. Situational anxiety - mother is actively dying and father is also in ALF with advanced dementia - pt is parents' POA and takes care of finances for them - PHQ-9 = 8, GAD-7 = 7 Rx: - ALPRAZolam (XANAX) 0.25 MG tablet; Take 1 tablet (0.25 mg total) by mouth 2 (two) times daily as needed for anxiety.  Dispense: 30 tablet; Refill: 0 - sertraline (ZOLOFT) 50 MG tablet; Take 1 tablet (50 mg total) by mouth daily.  Dispense: 90 tablet; Refill: 3 - f/u in 4 wks or sooner PRN

## 2019-06-13 ENCOUNTER — Telehealth: Payer: Self-pay

## 2019-06-13 NOTE — Telephone Encounter (Signed)
Questions for Screening COVID-19  Symptom onset: N/a  Travel or Contacts: No  During this illness, did/does the patient experience any of the following symptoms? Fever >100.4F []  Yes [x]  No []  Unknown Subjective fever (felt feverish) []  Yes [x]  No []  Unknown Chills []  Yes [x]  No []  Unknown Muscle aches (myalgia) []  Yes [x]  No []  Unknown Runny nose (rhinorrhea) []  Yes [x]  No []  Unknown Sore throat []  Yes [x]  No []  Unknown Cough (new onset or worsening of chronic cough) []  Yes [x]  No []  Unknown Shortness of breath (dyspnea) []  Yes [x]  No []  Unknown Nausea or vomiting []  Yes [x]  No []  Unknown Headache []  Yes [x]  No []  Unknown Abdominal pain  []  Yes [x]  No []  Unknown Diarrhea (?3 loose/looser than normal stools/24hr period) []  Yes [x]  No []  Unknown  

## 2019-06-14 ENCOUNTER — Encounter: Payer: Self-pay | Admitting: Family Medicine

## 2019-06-14 ENCOUNTER — Ambulatory Visit (INDEPENDENT_AMBULATORY_CARE_PROVIDER_SITE_OTHER): Payer: Medicare Other | Admitting: Family Medicine

## 2019-06-14 ENCOUNTER — Other Ambulatory Visit: Payer: Self-pay

## 2019-06-14 VITALS — BP 140/90 | HR 65 | Temp 98.0°F | Ht 64.0 in | Wt 148.6 lb

## 2019-06-14 DIAGNOSIS — Z1231 Encounter for screening mammogram for malignant neoplasm of breast: Secondary | ICD-10-CM | POA: Diagnosis not present

## 2019-06-14 DIAGNOSIS — Z8349 Family history of other endocrine, nutritional and metabolic diseases: Secondary | ICD-10-CM | POA: Diagnosis not present

## 2019-06-14 DIAGNOSIS — Z23 Encounter for immunization: Secondary | ICD-10-CM

## 2019-06-14 DIAGNOSIS — F4321 Adjustment disorder with depressed mood: Secondary | ICD-10-CM | POA: Diagnosis not present

## 2019-06-14 DIAGNOSIS — Z0000 Encounter for general adult medical examination without abnormal findings: Secondary | ICD-10-CM

## 2019-06-14 DIAGNOSIS — Z13820 Encounter for screening for osteoporosis: Secondary | ICD-10-CM

## 2019-06-14 DIAGNOSIS — F418 Other specified anxiety disorders: Secondary | ICD-10-CM | POA: Diagnosis not present

## 2019-06-14 LAB — ALT: ALT: 22 U/L (ref 0–35)

## 2019-06-14 LAB — BASIC METABOLIC PANEL
BUN: 13 mg/dL (ref 6–23)
CO2: 29 mEq/L (ref 19–32)
Calcium: 8.7 mg/dL (ref 8.4–10.5)
Chloride: 106 mEq/L (ref 96–112)
Creatinine, Ser: 0.52 mg/dL (ref 0.40–1.20)
GFR: 118.13 mL/min (ref >60.00)
Glucose, Bld: 98 mg/dL (ref 70–99)
Potassium: 4.1 mEq/L (ref 3.5–5.1)
Sodium: 140 mEq/L (ref 135–145)

## 2019-06-14 LAB — LIPID PANEL
Cholesterol: 239 mg/dL — ABNORMAL HIGH (ref 0–200)
HDL: 60.1 mg/dL (ref >39.00)
NonHDL: 179.3
Total CHOL/HDL Ratio: 4
Triglycerides: 213 mg/dL — ABNORMAL HIGH (ref 0.0–149.0)
VLDL: 42.6 mg/dL — ABNORMAL HIGH (ref 0.0–40.0)

## 2019-06-14 LAB — TSH: TSH: 1.49 u[IU]/mL (ref 0.35–4.50)

## 2019-06-14 LAB — LDL CHOLESTEROL, DIRECT: Direct LDL: 127 mg/dL

## 2019-06-14 LAB — AST: AST: 16 U/L (ref 0–37)

## 2019-06-14 LAB — VITAMIN D 25 HYDROXY (VIT D DEFICIENCY, FRACTURES): VITD: 39.4 ng/mL (ref 30.00–100.00)

## 2019-06-14 NOTE — Patient Instructions (Signed)

## 2019-06-14 NOTE — Progress Notes (Signed)
Chief Complaint  Patient presents with  . Follow-up    Blood work ??? follow up about depression and anxiety. Pt said she is feeling much better.    HPI: Tami Warner is a 63 y.o. female here to f/u on her anxiety and depressed mood in relation to her mother's chronic illness and decline. Patient's mother is in a SNF on what sounds like palliative care.  At [facility] on 05/17/19, pt was started on zoloft 50mg  daily and given Rx for xanax 0.25mg  BID PRN. Pt states she took the zoloft for about 1 week and felt it was helpful. She took 1 tab of xanax but felt she had a panic attack and was not able to sleep one night. She has not taken either med since that time (about 2 wks ago).   Today, pt states she is doing "much better". Pt states she's had a "mental reboot". She is trying not to "count down" her mother's "days left". She is sleeping, eating. She would like to restart the zoloft.   She is due for CPE and is fasting today so would like to get labs done. She is due for mammo and dexa. She would like shingles vaccine but not today as she is concerned about possible side effect of fever and not being able to see her mother in SNF. Flu and pneumonia vaccines UTD. Colonoscopy and PAP UTD.  Depression screen [facility] 2/9 05/17/2019  Decreased Interest 2  Down, Depressed, Hopeless 1  PHQ - 2 Score 3  Altered sleeping 1  Tired, decreased energy 2  Change in appetite 2  Feeling bad or failure about yourself  0  Trouble concentrating 0  Moving slowly or fidgety/restless 0  Suicidal thoughts 0  PHQ-9 Score 8    GAD 7 : Generalized Anxiety Score 05/17/2019  Nervous, Anxious, on Edge 2  Control/stop worrying 3  Worry too much - different things 1  Trouble relaxing 0  Restless 0  Easily annoyed or irritable 0  Afraid - awful might happen 1  Total GAD 7 Score 7      Past Medical History:  Diagnosis Date  . Hyperlipidemia   . Inflammatory polyps of colon without complications ([place])   . Osteopenia    . Peripheral neuropathy   . Psoriasis   . Restless leg syndrome   . Tremor     Past Surgical History:  Procedure Laterality Date  . CHOLECYSTECTOMY    . CYST EXCISION    . TONSILLECTOMY AND ADENOIDECTOMY      Social History   Socioeconomic History  . Marital status: Single    Spouse name: Not on file  . Number of children: 3  . Years of education: Not on file  . Highest education level: Not on file  Occupational History  . Occupation: toxicologist    Comment: [employer]  Social Needs  . Financial resource strain: Not on file  . Food insecurity    Worry: Not on file    Inability: Not on file  . Transportation needs    Medical: Not on file    Non-medical: Not on file  Tobacco Use  . Smoking status: Never Smoker  . Smokeless tobacco: Never Used  Substance and Sexual Activity  . Alcohol use: Yes    Comment: occ glass of wine  . Drug use: No  . Sexual activity: Not on file  Lifestyle  . Physical activity    Days per week: Not on file  Minutes per session: Not on file  . Stress: Not on file  Relationships  . Social Musicianconnections    Talks on phone: Not on file    Gets together: Not on file    Attends religious service: Not on file    Active member of club or organization: Not on file    Attends meetings of clubs or organizations: Not on file    Relationship status: Not on file  . Intimate partner violence    Fear of current or ex partner: Not on file    Emotionally abused: Not on file    Physically abused: Not on file    Forced sexual activity: Not on file  Other Topics Concern  . Not on file  Social History Narrative  . Not on file    Family History  Problem Relation Age of Onset  . Hyperlipidemia Mother   . Hypertension Mother   . Arthritis Mother   . Gout Mother   . Hyperlipidemia Father   . Hypertension Father   . Tremor Father   . COPD Sister   . Asthma Sister   . Hyperlipidemia Sister   . Tremor Sister   . Hyperlipidemia Brother   .  Alcohol abuse Son   . Drug abuse Son   . Breast cancer Maternal Aunt   . Hyperlipidemia Sister      Immunization History  Administered Date(s) Administered  . Fluad Quad(high Dose 65+) 05/17/2019  . Influenza-Unspecified 06/08/2012  . Pneumococcal Conjugate-13 05/17/2019  . Tdap 09/06/2008  . Zoster 12/22/2011    Outpatient Encounter Medications as of 06/14/2019  Medication Sig Note  . fluticasone (CUTIVATE) 0.05 % cream  08/13/2014: Received from: External Pharmacy  . Multiple Vitamin (MULTIVITAMIN) tablet Take 1 tablet by mouth daily.   . Omega-3 Fatty Acids (FISH OIL) 1000 MG CAPS Take by mouth.   . Probiotic Product (PROBIOTIC-10 PO) Take by mouth.   . Cholecalciferol (VITAMIN D-3) 25 MCG (1000 UT) CAPS Take 2,000 Units by mouth.   . [DISCONTINUED] ALPRAZolam (XANAX) 0.25 MG tablet Take 1 tablet (0.25 mg total) by mouth 2 (two) times daily as needed for anxiety. (Patient not taking: Reported on 06/14/2019)   . [DISCONTINUED] sertraline (ZOLOFT) 50 MG tablet Take 1 tablet (50 mg total) by mouth daily. (Patient not taking: Reported on 06/14/2019)    No facility-administered encounter medications on file as of 06/14/2019.      ROS: Gen: no fever, chills  Skin: no rash, itchin Eyes: no blurry vision, double vision Resp: no cough, wheeze,SOB CV: no CP, palpitations, LE edema,  GI: no n/v/d/c, abd pain Neuro: no dizziness, headache, weakness Psych: as above in HPI   No Known Allergies  BP 140/90   Pulse 65   Temp 98 F (36.7 C) (Oral)   Ht 5\' 4"  (1.626 m)   Wt 148 lb 9.6 oz (67.4 kg)   SpO2 97%   BMI 25.51 kg/m   BP Readings from Last 3 Encounters:  06/14/19 140/90  05/17/19 140/84  08/13/14 120/80    Physical Exam  Constitutional: She is oriented to person, place, and time. She appears well-developed and well-nourished. No distress.  Cardiovascular: Normal rate, regular rhythm and normal heart sounds.  Pulmonary/Chest: Effort normal and breath sounds normal.  No respiratory distress.  Neurological: She is alert and oriented to person, place, and time.  Psychiatric: She has a normal mood and affect. Her behavior is normal. Thought content normal.     A/P:  1. Situational  depression 2. Situational anxiety - much improved since last OV 4 wks ago - pt stopped zoloft after 1 week, despite symptom improvement, when she felt "panicy" and didn't sleep well one night. She would like to restart zoloft and see how she responds and I am agreeable to this - f/u PRN  3. Annual physical exam - discussed importance of regular CV exercise, healthy diet, adequate sleep - colonoscopy, PAP are UTD; due for mammo and Dexa - pt will RTO for shingrix vaccine, otherwise UTD - ALT - AST - Basic metabolic panel - Lipid panel - VITAMIN D 25 Hydroxy (Vit-D Deficiency, Fractures)  4. Family history of hypothyroidism - TSH  5. Encounter for screening mammogram for malignant neoplasm of breast - MM DIGITAL SCREENING BILATERAL; Future  6. Screening for osteoporosis - DG Bone Density; Future

## 2019-06-19 ENCOUNTER — Encounter: Payer: Self-pay | Admitting: Family Medicine

## 2019-06-25 ENCOUNTER — Other Ambulatory Visit: Payer: Self-pay

## 2019-06-25 ENCOUNTER — Ambulatory Visit (INDEPENDENT_AMBULATORY_CARE_PROVIDER_SITE_OTHER): Payer: Medicare Other | Admitting: Family Medicine

## 2019-06-25 ENCOUNTER — Encounter: Payer: Self-pay | Admitting: Family Medicine

## 2019-06-25 VITALS — BP 110/70 | HR 88 | Temp 98.3°F | Ht 64.0 in | Wt 147.6 lb

## 2019-06-25 DIAGNOSIS — M79674 Pain in right toe(s): Secondary | ICD-10-CM | POA: Diagnosis not present

## 2019-06-25 NOTE — Progress Notes (Signed)
Tami Warner - 63 y.o. female MRN [id number]  Date of birth: 09/04/1955  Subjective Chief Complaint  Patient presents with  . Toe Injury    patient fell on her right big toe this morning and its very painful no otc meds.    HPI Tami Warner is a 63 y.o. female with complaint of pain in R great toe.  She reports that she stumbled this morning and bent her great toe under her foot.  She felt a pop when this happened.  Area has been very sore with some discoloration.  She has been icing and keeping her foot elevated. She is taking ibuprofen for pain.  Pain is worse with weight bearing/walking and she is unable to flex the toe.   ROS:  A comprehensive ROS was completed and negative except as noted per HPI  No Known Allergies  Past Medical History:  Diagnosis Date  . Hyperlipidemia   . Inflammatory polyps of colon without complications (HCC)   . Osteopenia   . Peripheral neuropathy   . Psoriasis   . Restless leg syndrome   . Tremor     Past Surgical History:  Procedure Laterality Date  . CHOLECYSTECTOMY    . CYST EXCISION    . TONSILLECTOMY AND ADENOIDECTOMY      Social History   Socioeconomic History  . Marital status: Single    Spouse name: Not on file  . Number of children: 3  . Years of education: Not on file  . Highest education level: Not on file  Occupational History  . Occupation: toxicologist    Comment: [employer]  Social Needs  . Financial resource strain: Not on file  . Food insecurity    Worry: Not on file    Inability: Not on file  . Transportation needs    Medical: Not on file    Non-medical: Not on file  Tobacco Use  . Smoking status: Never Smoker  . Smokeless tobacco: Never Used  Substance and Sexual Activity  . Alcohol use: Yes    Comment: occ glass of wine  . Drug use: No  . Sexual activity: Not on file  Lifestyle  . Physical activity    Days per week: Not on file    Minutes per session: Not on file  . Stress: Not on file   Relationships  . Social Musician on phone: Not on file    Gets together: Not on file    Attends religious service: Not on file    Active member of club or organization: Not on file    Attends meetings of clubs or organizations: Not on file    Relationship status: Not on file  Other Topics Concern  . Not on file  Social History Narrative  . Not on file    Family History  Problem Relation Age of Onset  . Hyperlipidemia Mother   . Hypertension Mother   . Arthritis Mother   . Gout Mother   . Hyperlipidemia Father   . Hypertension Father   . Tremor Father   . COPD Sister   . Asthma Sister   . Hyperlipidemia Sister   . Tremor Sister   . Hyperlipidemia Brother   . Alcohol abuse Son   . Drug abuse Son   . Breast cancer Maternal Aunt   . Hyperlipidemia Sister     Health Maintenance  Topic Date Due  . Hepatitis C Screening  Apr 14, 1956  . HIV Screening  11/10/1968  . PAP  SMEAR-Modifier  11/11/1974  . COLONOSCOPY  11/11/2003  . MAMMOGRAM  05/30/2015  . TETANUS/TDAP  09/06/2018  . DEXA SCAN  11/11/2018  . PNA vac Low Risk Adult (2 of 2 - PPSV23) 05/16/2020  . INFLUENZA VACCINE  Completed    ----------------------------------------------------------------------------------------------------------------------------------------------------------------------------------------------------------------- Physical Exam BP 110/70   Pulse 88   Temp 98.3 F (36.8 C) (Temporal)   Ht 5\' 4"  (1.626 m)   Wt 147 lb 9.6 oz (67 kg)   SpO2 96%   BMI 25.34 kg/m   Physical Exam Constitutional:      Appearance: Normal appearance.  HENT:     Head: Normocephalic and atraumatic.  Musculoskeletal:     Comments: Mild swelling with ecchymoses at the interphalangeal joint. She has difficulty bending the toe and pain with walking.    Skin:    General: Skin is warm and dry.  Neurological:     General: No focal deficit present.     Mental Status: She is alert.  Psychiatric:         Mood and Affect: Mood normal.        Behavior: Behavior normal.     ------------------------------------------------------------------------------------------------------------------------------------------------------------------------------------------------------------------- Assessment and Plan  Pain of right great toe -Xrays ordered -Limit weight bearing for now.  -Continue icing, elevation.  May use ibuprofen prn for pain control

## 2019-06-25 NOTE — Assessment & Plan Note (Addendum)
-  Xrays ordered -Limit weight bearing for now.  -Continue icing, elevation.  May use ibuprofen prn for pain control

## 2019-06-25 NOTE — Patient Instructions (Signed)
We'll be in touch with xray results.  

## 2019-06-25 NOTE — Addendum Note (Signed)
Addended by: Lynnea Ferrier on: 06/25/2019 01:54 PM   Modules accepted: Orders

## 2019-06-26 ENCOUNTER — Ambulatory Visit (INDEPENDENT_AMBULATORY_CARE_PROVIDER_SITE_OTHER): Payer: Medicare Other

## 2019-06-26 DIAGNOSIS — M79674 Pain in right toe(s): Secondary | ICD-10-CM

## 2019-06-26 NOTE — Progress Notes (Signed)
Please let patient know that she has a non-displaced fracture of her great toe.  She can immobilize by taping to adjacent toe.  She should put a piece of cotton between the toes to avoid irritation.

## 2019-07-04 ENCOUNTER — Encounter: Payer: Self-pay | Admitting: Family Medicine

## 2019-07-04 ENCOUNTER — Other Ambulatory Visit: Payer: Self-pay

## 2019-07-04 ENCOUNTER — Telehealth (INDEPENDENT_AMBULATORY_CARE_PROVIDER_SITE_OTHER): Payer: Medicare Other | Admitting: Family Medicine

## 2019-07-04 DIAGNOSIS — Z20828 Contact with and (suspected) exposure to other viral communicable diseases: Secondary | ICD-10-CM | POA: Diagnosis not present

## 2019-07-04 DIAGNOSIS — Z20822 Contact with and (suspected) exposure to covid-19: Secondary | ICD-10-CM

## 2019-07-04 NOTE — Progress Notes (Signed)
Virtual Visit via Video Note  I connected with Tami Warner on 07/04/19 at 11:30 AM EST by a video enabled telemedicine application and verified that I am speaking with the correct person using two identifiers. Location patient: home Location provider: work  Persons participating in the virtual visit: patient, provider  I discussed the limitations of evaluation and management by telemedicine and the availability of in person appointments. The patient expressed understanding and agreed to proceed.  Chief Complaint  Patient presents with  . exposure to covid     HPI: Tami Warner is a 63 y.o. female  Her mother passed away early this AM. Pt and brother were able to visit mother yesterday afternoon. Father had been with mother/wife for 1 hour prior to patient being with mother. Father tested positive for COVID yesterday afternoon - he had low grade fever, fatigue. He, and pts mother, live/lived in ALF and they are tested weekly.  Pt is asymptomatic. She would like info about COVID testing   Past Medical History:  Diagnosis Date  . Hyperlipidemia   . Inflammatory polyps of colon without complications (HCC)   . Osteopenia   . Peripheral neuropathy   . Psoriasis   . Restless leg syndrome   . Tremor     Past Surgical History:  Procedure Laterality Date  . CHOLECYSTECTOMY    . CYST EXCISION    . TONSILLECTOMY AND ADENOIDECTOMY      Family History  Problem Relation Age of Onset  . Hyperlipidemia Mother   . Hypertension Mother   . Arthritis Mother   . Gout Mother   . Hyperlipidemia Father   . Hypertension Father   . Tremor Father   . COPD Sister   . Asthma Sister   . Hyperlipidemia Sister   . Tremor Sister   . Hyperlipidemia Brother   . Alcohol abuse Son   . Drug abuse Son   . Breast cancer Maternal Aunt   . Hyperlipidemia Sister     Social History   Tobacco Use  . Smoking status: Never Smoker  . Smokeless tobacco: Never Used  Substance Use Topics  . Alcohol  use: Yes    Comment: occ glass of wine  . Drug use: No     Current Outpatient Medications:  .  Cholecalciferol (VITAMIN D-3) 25 MCG (1000 UT) CAPS, Take 2,000 Units by mouth., Disp: , Rfl:  .  fluticasone (CUTIVATE) 0.05 % cream, , Disp: , Rfl:  .  Multiple Vitamin (MULTIVITAMIN) tablet, Take 1 tablet by mouth daily., Disp: , Rfl:  .  Omega-3 Fatty Acids (FISH OIL) 1000 MG CAPS, Take by mouth., Disp: , Rfl:  .  Probiotic Product (PROBIOTIC-10 PO), Take by mouth., Disp: , Rfl:   No Known Allergies    ROS: See pertinent positives and negatives per HPI.   EXAM:  VITALS per patient if applicable: There were no vitals taken for this visit.   GENERAL: alert, oriented, appears well and in no acute distress  NECK: normal movements of the head and neck  LUNGS: on inspection no signs of respiratory distress, breathing rate appears normal, no obvious gross SOB, gasping or wheezing, no conversational dyspnea  CV: no obvious cyanosis  MS: moves all visible extremities without noticeable abnormality  PSYCH/NEURO: pleasant and cooperative, speech and thought processing grossly intact   ASSESSMENT AND PLAN: 1. Close exposure to COVID-19 virus - pt is asymptomatic - exposure was yesterday so pt will wait until day 3-5 after exposure (Fri or Mon) and  will go to [facility] to get COVID test performed   I discussed the assessment and treatment plan with the patient. The patient was provided an opportunity to ask questions and all were answered. The patient agreed with the plan and demonstrated an understanding of the instructions.   The patient was advised to call back or seek an in-person evaluation if the symptoms worsen or if the condition fails to improve as anticipated.   [name], DO

## 2019-07-09 ENCOUNTER — Encounter: Payer: Self-pay | Admitting: Family Medicine

## 2019-07-11 LAB — NOVEL CORONAVIRUS, NAA: SARS-CoV-2, NAA: POSITIVE

## 2019-07-13 ENCOUNTER — Encounter: Payer: Self-pay | Admitting: Family Medicine

## 2019-07-13 ENCOUNTER — Telehealth (INDEPENDENT_AMBULATORY_CARE_PROVIDER_SITE_OTHER): Payer: Medicare Other | Admitting: Family Medicine

## 2019-07-13 VITALS — BP 158/87 | HR 78 | Ht 64.0 in | Wt 148.0 lb

## 2019-07-13 DIAGNOSIS — U071 COVID-19: Secondary | ICD-10-CM | POA: Diagnosis not present

## 2019-07-13 MED ORDER — ALBUTEROL SULFATE HFA 108 (90 BASE) MCG/ACT IN AERS: 2.0000 | INHALATION_SPRAY | Freq: Four times a day (QID) | RESPIRATORY_TRACT | 1 refills | Status: AC | PRN

## 2019-07-13 NOTE — Progress Notes (Signed)
Virtual Visit via Video Note  I connected with [name] on 07/13/19 at  2:00 PM EST by a video enabled telemedicine application and verified that I am speaking with the correct person using two identifiers. Location patient: home Location provider: work  Persons participating in the virtual visit: patient, provider  I discussed the limitations of evaluation and management by telemedicine and the availability of in person appointments. The patient expressed understanding and agreed to proceed.  Chief Complaint  Patient presents with  . Labs Only    lab results, positive COVID     HPI: Tami Warner is a 63 y.o. female who was informed of her positive COVID test result today. She was tested on Wed 07/11/19 at [facility]. She was likely exposed to her COVID+ father on 07/03/19 at ALF where he lives and when they were visiting her mother in her last hours of life. Pt states she was at [facility] on 07/02/19 and dentist on 06/27/19. Otherwise she has not been out in public.  She notes mild dry cough and some chest tightness. She is unsure of when these symptoms started because until today she thought they were allergy-related. Denies CP, fever, chills, SOB, GI symptoms, myalgias, LE edema.  She does not have a thermometer but daughter will drop one off along with a pulse ox and albuterol inhaler refill. Daughter is a Development worker, community. Pts father is in covid unit of ALF and "not doing well". pts mother passed away 1.5wks ago.  Past Medical History:  Diagnosis Date  . Hyperlipidemia   . Inflammatory polyps of colon without complications (HCC)   . Osteopenia   . Peripheral neuropathy   . Psoriasis   . Restless leg syndrome   . Tremor     Past Surgical History:  Procedure Laterality Date  . CHOLECYSTECTOMY    . CYST EXCISION    . TONSILLECTOMY AND ADENOIDECTOMY      Family History  Problem Relation Age of Onset  . Hyperlipidemia Mother   . Hypertension Mother   . Arthritis Mother   .  Gout Mother   . Hyperlipidemia Father   . Hypertension Father   . Tremor Father   . COPD Sister   . Asthma Sister   . Hyperlipidemia Sister   . Tremor Sister   . Hyperlipidemia Brother   . Alcohol abuse Son   . Drug abuse Son   . Breast cancer Maternal Aunt   . Hyperlipidemia Sister     Social History   Tobacco Use  . Smoking status: Never Smoker  . Smokeless tobacco: Never Used  Substance Use Topics  . Alcohol use: Yes    Comment: occ glass of wine  . Drug use: No     Current Outpatient Medications:  .  Cholecalciferol (VITAMIN D-3) 25 MCG (1000 UT) CAPS, Take 2,000 Units by mouth., Disp: , Rfl:  .  Multiple Vitamin (MULTIVITAMIN) tablet, Take 1 tablet by mouth daily., Disp: , Rfl:  .  Omega-3 Fatty Acids (FISH OIL) 1000 MG CAPS, Take by mouth., Disp: , Rfl:  .  Probiotic Product (PROBIOTIC-10 PO), Take by mouth., Disp: , Rfl:  .  fluticasone (CUTIVATE) 0.05 % cream, , Disp: , Rfl:   No Known Allergies    ROS: See pertinent positives and negatives per HPI.   EXAM:  VITALS per patient if applicable: BP (!) 158/87 (BP Location: Left Arm, Patient Position: Sitting, Cuff Size: Normal)   Pulse 78   Ht 5\' 4"  (1.626 m)  Wt 148 lb (67.1 kg)   BMI 25.40 kg/m   BP Readings from Last 3 Encounters:  07/13/19 (!) 158/87  06/25/19 110/70  06/14/19 140/90     GENERAL: alert, oriented, appears well but is tearful and somewhat anxious  HEENT: atraumatic, conjunctiva clear, no obvious abnormalities on inspection of external nose and ears  NECK: normal movements of the head and neck  LUNGS: on inspection no signs of respiratory distress, breathing rate appears normal, no obvious gross SOB, gasping or wheezing, no conversational dyspnea  CV: no obvious cyanosis  MS: moves all visible extremities without noticeable abnormality  PSYCH/NEURO: pleasant and cooperative, speech and thought processing grossly intact   ASSESSMENT AND PLAN:  1. COVID-19 virus  infection - positive test on 07/11/19 - pt plans to self-isolate x 10 days thru 11/27 - cont supportive care Rx refill: - albuterol (VENTOLIN HFA) 108 (90 Base) MCG/ACT inhaler; Inhale 2 puffs into the lungs every 6 (six) hours as needed for wheezing or shortness of breath.  Dispense: 8 g; Refill: 1 - MyChart COVID-19 home monitoring program; Future - f/u or seek care in ER if symptoms worsen   I discussed the assessment and treatment plan with the patient. The patient was provided an opportunity to ask questions and all were answered. The patient agreed with the plan and demonstrated an understanding of the instructions.   The patient was advised to call back or seek an in-person evaluation if the symptoms worsen or if the condition fails to improve as anticipated.   [name], DO

## 2019-07-15 ENCOUNTER — Encounter (INDEPENDENT_AMBULATORY_CARE_PROVIDER_SITE_OTHER): Payer: Self-pay

## 2019-07-16 ENCOUNTER — Encounter: Payer: Self-pay | Admitting: Family Medicine

## 2019-07-16 ENCOUNTER — Telehealth (INDEPENDENT_AMBULATORY_CARE_PROVIDER_SITE_OTHER): Payer: Medicare Other | Admitting: Family Medicine

## 2019-07-16 VITALS — BP 145/80 | HR 97

## 2019-07-16 DIAGNOSIS — J4520 Mild intermittent asthma, uncomplicated: Secondary | ICD-10-CM | POA: Diagnosis not present

## 2019-07-16 MED ORDER — PREDNISONE 10 MG PO TABS: 10.0000 mg | ORAL_TABLET | Freq: Two times a day (BID) | ORAL | 0 refills | Status: AC

## 2019-07-16 MED ORDER — PREDNISONE 20 MG PO TABS: 20.0000 mg | ORAL_TABLET | Freq: Two times a day (BID) | ORAL | 0 refills | Status: DC

## 2019-07-16 NOTE — Progress Notes (Addendum)
Established Patient Office Visit  Subjective:  Patient ID: Tami Warner, female    DOB: 05-Mar-1956  Age: 63 y.o. MRN: [id number]  CC:  Chief Complaint  Patient presents with  . Follow-up    positive covid test, having some chest tightness    HPI Tami Warner presents for follow-up status post diagnosis of Covid back on the 20th.  Patient has felt chest pressure.  She denies shortness of breath, chest pain, dyspnea on exertion, nausea, diaphoresis or palpitations.  Patient has been able to vacuum and practice yoga without worsening of her symptoms.  Mom just passed away back on the 12-28-2022.  Father recently diagnosed with Covid and is not supposed to live through this week.  Patient admits stress.  She was started on Zoloft and Xanax to use as needed.  She just started her Zoloft today at her daughter's urging.  Patient never smoked.  She has no history of hypertension but her blood pressure has been elevated over the last few visits as it is today.  Pressure typically runs in the 120-140/70 range.  Patient's LDL cholesterol is mildly elevated with a favorable HDL.  Past Medical History:  Diagnosis Date  . Hyperlipidemia   . Inflammatory polyps of colon without complications (HCC)   . Osteopenia   . Peripheral neuropathy   . Psoriasis   . Restless leg syndrome   . Tremor     Past Surgical History:  Procedure Laterality Date  . CHOLECYSTECTOMY    . CYST EXCISION    . TONSILLECTOMY AND ADENOIDECTOMY      Family History  Problem Relation Age of Onset  . Hyperlipidemia Mother   . Hypertension Mother   . Arthritis Mother   . Gout Mother   . Hyperlipidemia Father   . Hypertension Father   . Tremor Father   . COPD Sister   . Asthma Sister   . Hyperlipidemia Sister   . Tremor Sister   . Hyperlipidemia Brother   . Alcohol abuse Son   . Drug abuse Son   . Breast cancer Maternal Aunt   . Hyperlipidemia Sister     Social History   Socioeconomic History  . Marital status:  Single    Spouse name: Not on file  . Number of children: 3  . Years of education: Not on file  . Highest education level: Not on file  Occupational History  . Occupation: toxicologist    Comment: [employer]  Social Needs  . Financial resource strain: Not on file  . Food insecurity    Worry: Not on file    Inability: Not on file  . Transportation needs    Medical: Not on file    Non-medical: Not on file  Tobacco Use  . Smoking status: Never Smoker  . Smokeless tobacco: Never Used  Substance and Sexual Activity  . Alcohol use: Yes    Comment: occ glass of wine  . Drug use: No  . Sexual activity: Not on file  Lifestyle  . Physical activity    Days per week: Not on file    Minutes per session: Not on file  . Stress: Not on file  Relationships  . Social Musician on phone: Not on file    Gets together: Not on file    Attends religious service: Not on file    Active member of club or organization: Not on file    Attends meetings of clubs or organizations: Not on file  Relationship status: Not on file  . Intimate partner violence    Fear of current or ex partner: Not on file    Emotionally abused: Not on file    Physically abused: Not on file    Forced sexual activity: Not on file  Other Topics Concern  . Not on file  Social History Narrative  . Not on file    Outpatient Medications Prior to Visit  Medication Sig Dispense Refill  . albuterol (VENTOLIN HFA) 108 (90 Base) MCG/ACT inhaler Inhale 2 puffs into the lungs every 6 (six) hours as needed for wheezing or shortness of breath. 8 g 1  . Cholecalciferol (VITAMIN D-3) 25 MCG (1000 UT) CAPS Take 2,000 Units by mouth.    . fluticasone (CUTIVATE) 0.05 % cream     . Multiple Vitamin (MULTIVITAMIN) tablet Take 1 tablet by mouth daily.    . Omega-3 Fatty Acids (FISH OIL) 1000 MG CAPS Take by mouth.    . Probiotic Product (PROBIOTIC-10 PO) Take by mouth.     No facility-administered medications prior to visit.      No Known Allergies  ROS Review of Systems  Constitutional: Negative for chills, diaphoresis, fatigue, fever and unexpected weight change.  HENT: Positive for congestion and postnasal drip. Negative for rhinorrhea, sinus pressure and sinus pain.   Eyes: Negative for photophobia and visual disturbance.  Respiratory: Positive for cough and chest tightness. Negative for shortness of breath and wheezing.   Cardiovascular: Negative for chest pain and palpitations.  Gastrointestinal: Negative for nausea and vomiting.  Genitourinary: Negative.   Musculoskeletal: Negative for arthralgias and myalgias.  Skin: Negative for pallor and rash.  Allergic/Immunologic: Negative for immunocompromised state.  Neurological: Negative for headaches.  Hematological: Negative.   Psychiatric/Behavioral: The patient is nervous/anxious.       Objective:    Physical Exam  Constitutional: She is oriented to person, place, and time. She appears well-developed and well-nourished. No distress.  HENT:  Head: Normocephalic and atraumatic.  Right Ear: External ear normal.  Left Ear: External ear normal.  Eyes: Conjunctivae are normal. Right eye exhibits no discharge. Left eye exhibits no discharge.  Neck: No JVD present. No tracheal deviation present.  Pulmonary/Chest: Effort normal. No stridor.  Neurological: She is alert and oriented to person, place, and time.  Skin: Skin is warm and dry. She is not diaphoretic.  Psychiatric: She has a normal mood and affect. Her behavior is normal.    BP (!) 145/80   Pulse 97   SpO2 98%  Wt Readings from Last 3 Encounters:  07/13/19 148 lb (67.1 kg)  06/25/19 147 lb 9.6 oz (67 kg)  06/14/19 148 lb 9.6 oz (67.4 kg)   BP Readings from Last 3 Encounters:  07/16/19 (!) 145/80  07/13/19 (!) 158/87  06/25/19 110/70   Guideline developer:  UpToDate (see UpToDate for funding source) Date Released: June 2014  Health Maintenance Due  Topic Date Due  . Hepatitis C  Screening  06-06-1956  . HIV Screening  11/10/1968  . PAP SMEAR-Modifier  11/11/1974  . COLONOSCOPY  11/11/2003  . MAMMOGRAM  05/30/2015  . TETANUS/TDAP  09/06/2018  . DEXA SCAN  11/11/2018    There are no preventive care reminders to display for this patient.  Lab Results  Component Value Date   TSH 1.49 06/14/2019   Lab Results  Component Value Date   WBC 6.1 11/29/2013   HGB 14.3 11/29/2013   HCT 42.0 11/29/2013   MCV 92.1 11/29/2013  PLT 251.0 11/29/2013   Lab Results  Component Value Date   NA 140 06/14/2019   K 4.1 06/14/2019   CO2 29 06/14/2019   GLUCOSE 98 06/14/2019   BUN 13 06/14/2019   CREATININE 0.52 06/14/2019   BILITOT 0.8 11/29/2013   ALKPHOS 51 11/29/2013   AST 16 06/14/2019   ALT 22 06/14/2019   PROT 7.2 11/29/2013   ALBUMIN 4.3 11/29/2013   CALCIUM 8.7 06/14/2019   GFR 118.13 06/14/2019   Lab Results  Component Value Date   CHOL 239 (H) 06/14/2019   Lab Results  Component Value Date   HDL 60.10 06/14/2019   No results found for: [facility] Lab Results  Component Value Date   TRIG 213.0 (H) 06/14/2019   Lab Results  Component Value Date   CHOLHDL 4 06/14/2019   No results found for: HGBA1C    Assessment & Plan:   Problem List Items Addressed This Visit    None    Visit Diagnoses    Mild intermittent reactive airway disease without complication    -  Primary   Relevant Medications   predniSONE (DELTASONE) 10 MG tablet      Meds ordered this encounter  Medications  . DISCONTD: predniSONE (DELTASONE) 20 MG tablet    Sig: Take 1 tablet (20 mg total) by mouth 2 (two) times daily with a meal for 7 days.    Dispense:  14 tablet    Refill:  0  . predniSONE (DELTASONE) 10 MG tablet    Sig: Take 1 tablet (10 mg total) by mouth 2 (two) times daily with a meal for 5 days.    Dispense:  10 tablet    Refill:  0    Follow-up: Return in about 1 week (around 07/23/2019), or if symptoms worsen or fail to improve.    Encourage patient  to continue her Zoloft as directed.  She will go to the emergency room or urgent care as needed increased symptoms.  We will add low-dose prednisone.  Virtual visit with primary MD next week for recheck.  Virtual Visit via Video Note  I connected with Caprice RenshawDeborah Warner on 07/16/19 at  2:30 PM EST by a video enabled telemedicine application and verified that I am speaking with the correct person using two identifiers.  Location: Patient: home.  Patient seen at home and was alone through video conference. Provider:    I discussed the limitations of evaluation and management by telemedicine and the availability of in person appointments. The patient expressed understanding and agreed to proceed.  History of Present Illness:    Observations/Objective:   Assessment and Plan:   Follow Up Instructions:    I discussed the assessment and treatment plan with the patient. The patient was provided an opportunity to ask questions and all were answered. The patient agreed with the plan and demonstrated an understanding of the instructions.   The patient was advised to call back or seek an in-person evaluation if the symptoms worsen or if the condition fails to improve as anticipated.  I provided 20 minutes of non-face-to-face time during this encounter.   Mliss SaxWilliam Alfred Jamine Highfill, MD

## 2019-07-17 ENCOUNTER — Encounter (INDEPENDENT_AMBULATORY_CARE_PROVIDER_SITE_OTHER): Payer: Self-pay

## 2019-07-19 ENCOUNTER — Encounter (INDEPENDENT_AMBULATORY_CARE_PROVIDER_SITE_OTHER): Payer: Self-pay

## 2019-07-24 ENCOUNTER — Other Ambulatory Visit: Payer: Self-pay

## 2019-07-24 ENCOUNTER — Encounter (INDEPENDENT_AMBULATORY_CARE_PROVIDER_SITE_OTHER): Payer: Self-pay

## 2019-07-27 ENCOUNTER — Telehealth (INDEPENDENT_AMBULATORY_CARE_PROVIDER_SITE_OTHER): Payer: Medicare Other | Admitting: Family Medicine

## 2019-07-27 ENCOUNTER — Encounter: Payer: Self-pay | Admitting: Family Medicine

## 2019-07-27 DIAGNOSIS — F418 Other specified anxiety disorders: Secondary | ICD-10-CM | POA: Diagnosis not present

## 2019-07-27 DIAGNOSIS — Z8616 Personal history of COVID-19: Secondary | ICD-10-CM

## 2019-07-27 DIAGNOSIS — F4321 Adjustment disorder with depressed mood: Secondary | ICD-10-CM | POA: Diagnosis not present

## 2019-07-27 DIAGNOSIS — Z8619 Personal history of other infectious and parasitic diseases: Secondary | ICD-10-CM | POA: Diagnosis not present

## 2019-07-27 MED ORDER — SERTRALINE HCL 50 MG PO TABS: 50.0000 mg | ORAL_TABLET | Freq: Every day | ORAL | 3 refills | Status: DC

## 2019-07-27 NOTE — Progress Notes (Signed)
Virtual Visit via Video Note  I connected with Caprice Renshaw on 07/27/19 at 10:30 AM EST by a video enabled telemedicine application and verified that I am speaking with the correct person using two identifiers. Location patient: home Location provider: work  Persons participating in the virtual visit: patient, provider  I discussed the limitations of evaluation and management by telemedicine and the availability of in person appointments. The patient expressed understanding and agreed to proceed.  No chief complaint on file.   HPI: David Towson is a 63 y.o. female to f/u on increased anxiety, grief reaction. He father passed away on 08-07-23 from COVID and mother had passed away on 08/23/2019. Overall pt is feeling better with regard to COVID symptoms. No fever, cough, SOB. She endorses some small amount of chest tightness but not enough to use her PRN albuterol inhaler. She is sleeping well at night. She is eating. Patient states pulse O2 = 96-98% She has been taking zoloft 50mg  daily and does feel this has been effective. She would like to continue on it.   Past Medical History:  Diagnosis Date  . Hyperlipidemia   . Inflammatory polyps of colon without complications (HCC)   . Osteopenia   . Peripheral neuropathy   . Psoriasis   . Restless leg syndrome   . Tremor     Past Surgical History:  Procedure Laterality Date  . CHOLECYSTECTOMY    . CYST EXCISION    . TONSILLECTOMY AND ADENOIDECTOMY      Family History  Problem Relation Age of Onset  . Hyperlipidemia Mother   . Hypertension Mother   . Arthritis Mother   . Gout Mother   . Hyperlipidemia Father   . Hypertension Father   . Tremor Father   . COPD Sister   . Asthma Sister   . Hyperlipidemia Sister   . Tremor Sister   . Hyperlipidemia Brother   . Alcohol abuse Son   . Drug abuse Son   . Breast cancer Maternal Aunt   . Hyperlipidemia Sister     Social History   Tobacco Use  . Smoking status: Never Smoker  .  Smokeless tobacco: Never Used  Substance Use Topics  . Alcohol use: Yes    Comment: occ glass of wine  . Drug use: No     Current Outpatient Medications:  .  albuterol (VENTOLIN HFA) 108 (90 Base) MCG/ACT inhaler, Inhale 2 puffs into the lungs every 6 (six) hours as needed for wheezing or shortness of breath., Disp: 8 g, Rfl: 1 .  Cholecalciferol (VITAMIN D-3) 25 MCG (1000 UT) CAPS, Take 2,000 Units by mouth., Disp: , Rfl:  .  fluticasone (CUTIVATE) 0.05 % cream, , Disp: , Rfl:  .  Multiple Vitamin (MULTIVITAMIN) tablet, Take 1 tablet by mouth daily., Disp: , Rfl:  .  Omega-3 Fatty Acids (FISH OIL) 1000 MG CAPS, Take by mouth., Disp: , Rfl:  .  Probiotic Product (PROBIOTIC-10 PO), Take by mouth., Disp: , Rfl:   No Known Allergies    ROS: See pertinent positives and negatives per HPI.   EXAM:  VITALS per patient if applicable: There were no vitals taken for this visit.   GENERAL: alert, oriented, appears well and in no acute distress  HEENT: atraumatic, conjunctiva clear, no obvious abnormalities on inspection of external nose and ears  NECK: normal movements of the head and neck  LUNGS: on inspection no signs of respiratory distress, breathing rate appears normal, no obvious gross SOB, gasping or wheezing,  no conversational dyspnea  CV: no obvious cyanosis  PSYCH/NEURO: pleasant and cooperative, speech and thought processing grossly intact   ASSESSMENT AND PLAN: 1. History of 2019 novel coronavirus disease (COVID-19) - symptoms much-improved  - f/u PRN  2. Situational depression 3. Situational anxiety - both parents passed away in the past month - doing well on zoloft, continue this  - sertraline (ZOLOFT) 50 MG tablet; Take 1 tablet (50 mg total) by mouth daily.  Dispense: 90 tablet; Refill: 3 - f/u in 3 mo or sooner PRN  I discussed the assessment and treatment plan with the patient. The patient was provided an opportunity to ask questions and all were answered.  The patient agreed with the plan and demonstrated an understanding of the instructions.   The patient was advised to call back or seek an in-person evaluation if the symptoms worsen or if the condition fails to improve as anticipated.   [name], DO

## 2019-09-12 ENCOUNTER — Ambulatory Visit
Admission: RE | Admit: 2019-09-12 | Discharge: 2019-09-12 | Disposition: A | Discharge status: Home / Self Care | Payer: Medicare Other | Source: Ambulatory Visit | Attending: Family Medicine | Admitting: Family Medicine

## 2019-09-12 ENCOUNTER — Other Ambulatory Visit: Payer: Self-pay

## 2019-09-12 ENCOUNTER — Encounter: Payer: Self-pay | Admitting: Family Medicine

## 2019-09-12 DIAGNOSIS — Z13820 Encounter for screening for osteoporosis: Secondary | ICD-10-CM

## 2019-09-12 DIAGNOSIS — Z1231 Encounter for screening mammogram for malignant neoplasm of breast: Secondary | ICD-10-CM

## 2019-10-23 NOTE — Progress Notes (Signed)
Virtual Visit via Audio Note  I connected with patient on 10/24/19 at 11:00 AM EST by audio enabled telemedicine application and verified that I am speaking with the correct person using two identifiers.   THIS ENCOUNTER IS A VIRTUAL VISIT DUE TO COVID-19 - PATIENT WAS NOT SEEN IN THE OFFICE. PATIENT HAS CONSENTED TO VIRTUAL VISIT / TELEMEDICINE VISIT   Location of patient: home  Location of provider: office  I discussed the limitations of evaluation and management by telemedicine and the availability of in person appointments. The patient expressed understanding and agreed to proceed.   Subjective:   Tami Warner is a 64 y.o. female who presents for an Initial Medicare Annual Wellness Visit.  Review of Systems   Home Safety/Smoke Alarms: Feels safe in home. Smoke alarms in place.  Lives alone. 2nd floor apt.    Female:   Mammo-  09/12/19     Dexa scan-  09/12/19 [facility]- pt will discuss w/ PCP at next visit.    Objective:     Advanced Directives 10/24/2019  Does Patient Have a Medical Advance Directive? No  Would patient like information on creating a medical advance directive? No - Patient declined    Current Medications (verified) Outpatient Encounter Medications as of 10/24/2019  Medication Sig  . Cholecalciferol (VITAMIN D-3) 25 MCG (1000 UT) CAPS Take 2,000 Units by mouth.  . fluticasone (CUTIVATE) 0.05 % cream   . Multiple Vitamin (MULTIVITAMIN) tablet Take 1 tablet by mouth daily.  . Omega-3 Fatty Acids (FISH OIL) 1000 MG CAPS Take by mouth.  . Probiotic Product (PROBIOTIC-10 PO) Take by mouth.  . sertraline (ZOLOFT) 50 MG tablet Take 1 tablet (50 mg total) by mouth daily.  Marland Kitchen albuterol (VENTOLIN HFA) 108 (90 Base) MCG/ACT inhaler Inhale 2 puffs into the lungs every 6 (six) hours as needed for wheezing or shortness of breath. (Patient not taking: Reported on 10/24/2019)   No facility-administered encounter medications on file as of 10/24/2019.    Allergies  (verified) Patient has no known allergies.   History: Past Medical History:  Diagnosis Date  . Hyperlipidemia   . Inflammatory polyps of colon without complications ([place])   . Osteopenia   . Peripheral neuropathy   . Psoriasis   . Restless leg syndrome   . Tremor    Past Surgical History:  Procedure Laterality Date  . CHOLECYSTECTOMY    . CYST EXCISION    . TONSILLECTOMY AND ADENOIDECTOMY     Family History  Problem Relation Age of Onset  . Hyperlipidemia Mother   . Hypertension Mother   . Arthritis Mother   . Gout Mother   . Hyperlipidemia Father   . Hypertension Father   . Tremor Father   . COPD Sister   . Asthma Sister   . Hyperlipidemia Sister   . Tremor Sister   . Hyperlipidemia Brother   . Alcohol abuse Son   . Drug abuse Son   . Breast cancer Maternal Aunt   . Hyperlipidemia Sister    Social History   Socioeconomic History  . Marital status: Single    Spouse name: Not on file  . Number of children: 3  . Years of education: Not on file  . Highest education level: Not on file  Occupational History  . Occupation: toxicologist    Comment: [employer]  Tobacco Use  . Smoking status: Never Smoker  . Smokeless tobacco: Never Used  Substance and Sexual Activity  . Alcohol use: Yes    Comment: occ  glass of wine  . Drug use: No  . Sexual activity: Not on file  Other Topics Concern  . Not on file  Social History Narrative  . Not on file   Social Determinants of Health   Financial Resource Strain: Low Risk   . Difficulty of Paying Living Expenses: Not hard at all  Food Insecurity: No Food Insecurity  . Worried About Programme researcher, broadcasting/film/video in the Last Year: Never true  . Ran Out of Food in the Last Year: Never true  Transportation Needs: No Transportation Needs  . Lack of Transportation (Medical): No  . Lack of Transportation (Non-Medical): No  Physical Activity:   . Days of Exercise per Week: Not on file  . Minutes of Exercise per Session: Not on file   Stress:   . Feeling of Stress : Not on file  Social Connections:   . Frequency of Communication with Friends and Family: Not on file  . Frequency of Social Gatherings with Friends and Family: Not on file  . Attends Religious Services: Not on file  . Active Member of Clubs or Organizations: Not on file  . Attends Banker Meetings: Not on file  . Marital Status: Not on file    Tobacco Counseling Counseling given: Not Answered   Clinical Intake: Pain : No/denies pain    Activities of Daily Living In your present state of health, do you have any difficulty performing the following activities: 10/24/2019  Hearing? N  Vision? N  Difficulty concentrating or making decisions? N  Walking or climbing stairs? N  Dressing or bathing? N  Doing errands, shopping? N  Preparing Food and eating ? N  Using the Toilet? N  In the past six months, have you accidently leaked urine? N  Do you have problems with loss of bowel control? N  Managing your Medications? N  Managing your Finances? N  Housekeeping or managing your Housekeeping? N  Some recent data might be hidden     Immunizations and Health Maintenance Immunization History  Administered Date(s) Administered  . Fluad Quad(high Dose 65+) 05/17/2019  . Influenza-Unspecified 06/08/2012  . Pneumococcal Conjugate-13 05/17/2019  . Tdap 09/06/2008  . Zoster 12/22/2011   Health Maintenance Due  Topic Date Due  . Hepatitis C Screening  11/07/1955  . HIV Screening  11/10/1968  . PAP SMEAR-Modifier  11/11/1974  . COLONOSCOPY  11/11/2003  . TETANUS/TDAP  09/06/2018    Patient Care Team: Overton Mam, DO as PCP - General (Family Medicine)  Indicate any recent Medical Services you may have received from other than [facility] providers in the past year (date may be approximate).     Assessment:   This is a routine wellness examination for Tami Warner. Physical assessment deferred to PCP.  Hearing/Vision screen Unable to  assess. This visit is enabled though telemedicine due to Covid 19.   Dietary issues and exercise activities discussed: Current Exercise Habits: The patient does not participate in regular exercise at present, Exercise limited by: None identified Diet (meal preparation, eat out, water intake, caffeinated beverages, dairy products, fruits and vegetables): in general, a "healthy" diet  , well balanced    Goals   None    Depression Screen PHQ 2/9 Scores 10/24/2019 05/17/2019  PHQ - 2 Score 1 3  PHQ- 9 Score - 8    Fall Risk Fall Risk  10/24/2019  Falls in the past year? 0  Number falls in past yr: 0  Injury with Fall? 0  Follow up Education provided;Falls prevention discussed    Cognitive Function: Ad8 score reviewed for issues:  Issues making decisions:no  Less interest in hobbies / activities:no  Repeats questions, stories (family complaining):no  Trouble using ordinary gadgets (microwave, computer, phone):no  Forgets the month or year: no  Mismanaging finances: no  Remembering appts:no  Daily problems with thinking and/or memory:no Ad8 score is=0         Screening Tests Health Maintenance  Topic Date Due  . Hepatitis C Screening  1955-09-01  . HIV Screening  11/10/1968  . PAP SMEAR-Modifier  11/11/1974  . COLONOSCOPY  11/11/2003  . TETANUS/TDAP  09/06/2018  . PNA vac Low Risk Adult (2 of 2 - PPSV23) 05/16/2020  . MAMMOGRAM  09/11/2021  . INFLUENZA VACCINE  Completed  . DEXA SCAN  Completed     Plan:    Please schedule your next medicare wellness visit with me in 1 yr.  Continue to eat heart healthy diet (full of fruits, vegetables, whole grains, lean protein, water--limit salt, fat, and sugar intake) and increase physical activity as tolerated.  Continue doing brain stimulating activities (puzzles, reading, adult coloring books, staying active) to keep memory sharp.   Bring a copy of your living will and/or healthcare power of attorney to your next  office visit.    I have personally reviewed and noted the following in the patient's chart:   . Medical and social history . Use of alcohol, tobacco or illicit drugs  . Current medications and supplements . Functional ability and status . Nutritional status . Physical activity . Advanced directives . List of other physicians . Hospitalizations, surgeries, and ER visits in previous 12 months . Vitals . Screenings to include cognitive, depression, and falls . Referrals and appointments  In addition, I have reviewed and discussed with patient certain preventive protocols, quality metrics, and best practice recommendations. A written personalized care plan for preventive services as well as general preventive health recommendations were provided to patient.     [place]on Gully, [place]   10/24/2019

## 2019-10-24 ENCOUNTER — Ambulatory Visit (INDEPENDENT_AMBULATORY_CARE_PROVIDER_SITE_OTHER): Payer: Medicare Other | Admitting: *Deleted

## 2019-10-24 ENCOUNTER — Encounter: Payer: Self-pay | Admitting: *Deleted

## 2019-10-24 DIAGNOSIS — Z0000 Encounter for general adult medical examination without abnormal findings: Secondary | ICD-10-CM

## 2019-10-24 NOTE — Patient Instructions (Addendum)
Please schedule your next medicare wellness visit with me in 1 yr.  Continue to eat heart healthy diet (full of fruits, vegetables, whole grains, lean protein, water--limit salt, fat, and sugar intake) and increase physical activity as tolerated.  Continue doing brain stimulating activities (puzzles, reading, adult coloring books, staying active) to keep memory sharp.   Bring a copy of your living will and/or healthcare power of attorney to your next office visit.   Tami Warner , Thank you for taking time to come for your Medicare Wellness Visit. I appreciate your ongoing commitment to your health goals. Please review the following plan we discussed and let me know if I can assist you in the future.   These are the goals we discussed: Goals    . Increase physical activity       This is a list of the screening recommended for you and due dates:  Health Maintenance  Topic Date Due  .  Hepatitis C: One time screening is recommended by Center for Disease Control  (CDC) for  adults born from [birth year] through 1967.   06-15-1956  . HIV Screening  11/10/1968  . Pap Smear  11/11/1974  . Colon Cancer Screening  11/11/2003  . Tetanus Vaccine  09/06/2018  . Pneumonia vaccines (2 of 2 - PPSV23) 05/16/2020  . Mammogram  09/11/2021  . Flu Shot  Completed  . DEXA scan (bone density measurement)  Completed    Preventive Care 52-24 Years Old, Female Preventive care refers to visits with your health care provider and lifestyle choices that can promote health and wellness. This includes:  A yearly physical exam. This may also be called an annual well check.  Regular dental visits and eye exams.  Immunizations.  Screening for certain conditions.  Healthy lifestyle choices, such as eating a healthy diet, getting regular exercise, not using drugs or products that contain nicotine and tobacco, and limiting alcohol use. What can I expect for my preventive care visit? Physical exam Your health care  provider will check your:  Height and weight. This may be used to calculate body mass index (BMI), which tells if you are at a healthy weight.  Heart rate and blood pressure.  Skin for abnormal spots. Counseling Your health care provider may ask you questions about your:  Alcohol, tobacco, and drug use.  Emotional well-being.  Home and relationship well-being.  Sexual activity.  Eating habits.  Work and work Statistician.  Method of birth control.  Menstrual cycle.  Pregnancy history. What immunizations do I need?  Influenza (flu) vaccine  This is recommended every year. Tetanus, diphtheria, and pertussis (Tdap) vaccine  You may need a Td booster every 10 years. Varicella (chickenpox) vaccine  You may need this if you have not been vaccinated. Zoster (shingles) vaccine  You may need this after age 17. Measles, mumps, and rubella (MMR) vaccine  You may need at least one dose of MMR if you were born in 1957 or later. You may also need a second dose. Pneumococcal conjugate (PCV13) vaccine  You may need this if you have certain conditions and were not previously vaccinated. Pneumococcal polysaccharide (PPSV23) vaccine  You may need one or two doses if you smoke cigarettes or if you have certain conditions. Meningococcal conjugate (MenACWY) vaccine  You may need this if you have certain conditions. Hepatitis A vaccine  You may need this if you have certain conditions or if you travel or work in places where you may be exposed to hepatitis  A. Hepatitis B vaccine  You may need this if you have certain conditions or if you travel or work in places where you may be exposed to hepatitis B. Haemophilus influenzae type b (Hib) vaccine  You may need this if you have certain conditions. Human papillomavirus (HPV) vaccine  If recommended by your health care provider, you may need three doses over 6 months. You may receive vaccines as individual doses or as more than  one vaccine together in one shot (combination vaccines). Talk with your health care provider about the risks and benefits of combination vaccines. What tests do I need? Blood tests  Lipid and cholesterol levels. These may be checked every 5 years, or more frequently if you are over 90 years old.  Hepatitis C test.  Hepatitis B test. Screening  Lung cancer screening. You may have this screening every year starting at age 70 if you have a 30-pack-year history of smoking and currently smoke or have quit within the past 15 years.  Colorectal cancer screening. All adults should have this screening starting at age 50 and continuing until age 71. Your health care provider may recommend screening at age 69 if you are at increased risk. You will have tests every 1-10 years, depending on your results and the type of screening test.  Diabetes screening. This is done by checking your blood sugar (glucose) after you have not eaten for a while (fasting). You may have this done every 1-3 years.  Mammogram. This may be done every 1-2 years. Talk with your health care provider about when you should start having regular mammograms. This may depend on whether you have a family history of breast cancer.  BRCA-related cancer screening. This may be done if you have a family history of breast, ovarian, tubal, or peritoneal cancers.  Pelvic exam and Pap test. This may be done every 3 years starting at age 34. Starting at age 6, this may be done every 5 years if you have a Pap test in combination with an HPV test. Other tests  Sexually transmitted disease (STD) testing.  Bone density scan. This is done to screen for osteoporosis. You may have this scan if you are at high risk for osteoporosis. Follow these instructions at home: Eating and drinking  Eat a diet that includes fresh fruits and vegetables, whole grains, lean protein, and low-fat dairy.  Take vitamin and mineral supplements as recommended by your  health care provider.  Do not drink alcohol if: ? Your health care provider tells you not to drink. ? You are pregnant, may be pregnant, or are planning to become pregnant.  If you drink alcohol: ? Limit how much you have to 0-1 drink a day. ? Be aware of how much alcohol is in your drink. In the [place] one drink equals one 12 oz bottle of beer (355 mL), one 5 oz glass of wine (148 mL), or one 1 oz glass of hard liquor (44 mL). Lifestyle  Take daily care of your teeth and gums.  Stay active. Exercise for at least 30 minutes on 5 or more days each week.  Do not use any products that contain nicotine or tobacco, such as cigarettes, e-cigarettes, and chewing tobacco. If you need help quitting, ask your health care provider.  If you are sexually active, practice safe sex. Use a condom or other form of birth control (contraception) in order to prevent pregnancy and STIs (sexually transmitted infections).  If told by your health care provider,  take low-dose aspirin daily starting at age 41. What's next?  Visit your health care provider once a year for a well check visit.  Ask your health care provider how often you should have your eyes and teeth checked.  Stay up to date on all vaccines. This information is not intended to replace advice given to you by your health care provider. Make sure you discuss any questions you have with your health care provider. Document Revised: 04/20/2018 Document Reviewed: 04/20/2018 Elsevier Patient Education  2020 [employer].

## 2019-11-07 ENCOUNTER — Encounter: Payer: Self-pay | Admitting: Family Medicine

## 2019-11-07 NOTE — Progress Notes (Signed)
[facility]/thx dmf

## 2019-11-13 NOTE — Patient Instructions (Signed)
Health Maintenance Due  Topic Date Due  . Hepatitis C Screening  Never done  . COLONOSCOPY  Never done  . TETANUS/TDAP  09/06/2018    Depression screen PHQ 2/9 10/24/2019 05/17/2019  Decreased Interest 1 2  Down, Depressed, Hopeless 0 1  PHQ - 2 Score 1 3  Altered sleeping - 1  Tired, decreased energy - 2  Change in appetite - 2  Feeling bad or failure about yourself  - 0  Trouble concentrating - 0  Moving slowly or fidgety/restless - 0  Suicidal thoughts - 0  PHQ-9 Score - 8

## 2019-11-14 ENCOUNTER — Ambulatory Visit: Payer: Medicare Other | Admitting: Family Medicine

## 2019-11-14 ENCOUNTER — Other Ambulatory Visit: Payer: Self-pay

## 2019-11-15 ENCOUNTER — Ambulatory Visit (INDEPENDENT_AMBULATORY_CARE_PROVIDER_SITE_OTHER): Payer: Medicare Other | Admitting: Family Medicine

## 2019-11-15 ENCOUNTER — Encounter: Payer: Self-pay | Admitting: Family Medicine

## 2019-11-15 ENCOUNTER — Encounter: Payer: Self-pay | Admitting: Neurology

## 2019-11-15 VITALS — BP 110/80 | HR 59 | Temp 97.5°F | Ht 64.75 in | Wt 137.8 lb

## 2019-11-15 DIAGNOSIS — F418 Other specified anxiety disorders: Secondary | ICD-10-CM

## 2019-11-15 DIAGNOSIS — F4321 Adjustment disorder with depressed mood: Secondary | ICD-10-CM

## 2019-11-15 DIAGNOSIS — R251 Tremor, unspecified: Secondary | ICD-10-CM | POA: Diagnosis not present

## 2019-11-15 NOTE — Progress Notes (Signed)
Tami Warner is a 64 y.o. female  Chief Complaint  Patient presents with  . Follow-up    Pt tested pos for Covid in 11/20. Pt has had both covid vaccines.    HPI: Tami Warner is a 64 y.o. female here for routine f/u on anxiety/depression. She had both covid vaccines. 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 She has been "more chill". No side effects. She would like to continue this.   Pt notes family h/o benign essential tremor. Lt hand has worsened in the past few months. She does believe father had parkinson's. Sister had tremor, on phenobarb.  Past Medical History:  Diagnosis Date  . Hyperlipidemia   . Inflammatory polyps of colon without complications ([place])   . Osteopenia   . Peripheral neuropathy   . Psoriasis   . Restless leg syndrome   . Tremor     Past Surgical History:  Procedure Laterality Date  . CHOLECYSTECTOMY    . CYST EXCISION    . TONSILLECTOMY AND ADENOIDECTOMY      Social History   Socioeconomic History  . Marital status: Single    Spouse name: Not on file  . Number of children: 3  . Years of education: Not on file  . Highest education level: Not on file  Occupational History  . Occupation: toxicologist    Comment: [employer]  Tobacco Use  . Smoking status: Never Smoker  . Smokeless tobacco: Never Used  Substance and Sexual Activity  . Alcohol use: Yes    Comment: occ glass of wine  . Drug use: No  . Sexual activity: Not on file  Other Topics Concern  . Not on file  Social History Narrative  . Not on file   Social Determinants of Health   Financial Resource Strain: Low Risk   . Difficulty of Paying Living Expenses: Not hard at all  Food Insecurity: No Food Insecurity  . Worried About Charity fundraiser in the Last Year: Never true  . Ran Out of Food in the Last Year: Never true  Transportation Needs: No Transportation Needs  . Lack of Transportation (Medical): No  . Lack of Transportation (Non-Medical): No    Physical Activity:   . Days of Exercise per Week:   . Minutes of Exercise per Session:   Stress:   . Feeling of Stress :   Social Connections:   . Frequency of Communication with Friends and Family:   . Frequency of Social Gatherings with Friends and Family:   . Attends Religious Services:   . Active Member of Clubs or Organizations:   . Attends Archivist Meetings:   Marland Kitchen Marital Status:   Intimate Partner Violence:   . Fear of Current or Ex-Partner:   . Emotionally Abused:   Marland Kitchen Physically Abused:   . Sexually Abused:     Family History  Problem Relation Age of Onset  . Hyperlipidemia Mother   . Hypertension Mother   . Arthritis Mother   . Gout Mother   . Hyperlipidemia Father   . Hypertension Father   . Tremor Father   . COPD Sister   . Asthma Sister   . Hyperlipidemia Sister   . Tremor Sister   . Hyperlipidemia Brother   . Alcohol abuse Son   . Drug abuse Son   . Breast cancer Maternal Aunt   . Hyperlipidemia Sister      Immunization History  Administered Date(s) Administered  . Fluad Quad(high Dose 65+)  05/17/2019  . Influenza-Unspecified 06/08/2012  . PFIZER SARS-COV-2 Vaccination 10/06/2019, 10/31/2019  . Pneumococcal Conjugate-13 05/17/2019  . Tdap 09/06/2008  . Zoster 12/22/2011    Outpatient Encounter Medications as of 11/15/2019  Medication Sig Note  . albuterol (VENTOLIN HFA) 108 (90 Base) MCG/ACT inhaler Inhale 2 puffs into the lungs every 6 (six) hours as needed for wheezing or shortness of breath.   . Cholecalciferol (VITAMIN D-3) 25 MCG (1000 UT) CAPS Take 2,000 Units by mouth.   . fluticasone (CUTIVATE) 0.05 % cream  08/13/2014: Received from: External Pharmacy  . Multiple Vitamin (MULTIVITAMIN) tablet Take 1 tablet by mouth daily.   . Omega-3 Fatty Acids (FISH OIL) 1000 MG CAPS Take by mouth.   . Probiotic Product (PROBIOTIC-10 PO) Take by mouth.   . sertraline (ZOLOFT) 50 MG tablet Take 1 tablet (50 mg total) by mouth daily.    No  facility-administered encounter medications on file as of 11/15/2019.     ROS: Pertinent positives and negatives noted in HPI. Remainder of ROS non-contributory    No Known Allergies  BP 110/80 (BP Location: Left Arm, Patient Position: Sitting, Cuff Size: Normal)   Pulse (!) 59   Temp (!) 97.5 F (36.4 C) (Temporal)   Ht 5' 4.75" (1.645 m)   Wt 137 lb 12.8 oz (62.5 kg)   SpO2 97%   BMI 23.11 kg/m   Physical Exam  Constitutional: She is oriented to person, place, and time. She appears well-developed and well-nourished. No distress.  Cardiovascular: Normal rate, regular rhythm and normal heart sounds.  Pulmonary/Chest: Effort normal and breath sounds normal. No respiratory distress.  Musculoskeletal:        General: No edema.  Neurological: She is alert and oriented to person, place, and time.  Skin: Skin is warm and dry.  Psychiatric: She has a normal mood and affect. Her behavior is normal.    A/P:  1. Situational depression 2. Situational anxiety - stable - cont zoloft 50mg  daily - f/u in 6 mo or sooner PRN  3. Tremor - Ambulatory referral to Neurology   This visit occurred during the SARS-CoV-2 public health emergency.  Safety protocols were in place, including screening questions prior to the visit, additional usage of staff PPE, and extensive cleaning of exam room while observing appropriate contact time as indicated for disinfecting solutions.

## 2019-11-16 ENCOUNTER — Other Ambulatory Visit: Payer: Self-pay | Admitting: Family Medicine

## 2019-11-16 ENCOUNTER — Encounter: Payer: Self-pay | Admitting: Family Medicine

## 2019-12-07 NOTE — Progress Notes (Signed)
Assessment/Plan:   Tremor  -Reassured the patient that I saw no evidence of Parkinson's disease.  She does not meet [place] brain bank criteria for the disease.  She was worried about Parkinson's because of a potential family history (she suspected her dad had Parkinson's but was not sure).  I also reassured her that most Parkinson's disease is not genetic.  -Discussed with the patient that tremor may be multifactorial.  She has noticed tremor increasing since prior to her parents death in 2023/08/02.  We talked about the fact that anxiety can be playing a role.  She was placed on sertraline around that time, which could slightly increase the tremor.  -Discussed that I would not recommend medication right now, and she really does not want any.  We discussed nonmedicinal treatments, especially given the fact that a weight seemed to help in the office.  We talked about wrist weights and weighted gloves, which can be bought off of [employer].  -Patient asked questions and I answered those to the best of my ability.  -She will follow up with me on an as-needed basis.  Subjective:   Sherrine Salberg was seen today in the movement disorders clinic for neurologic consultation at the request of Overton Mam, DO.  The consultation is for the evaluation of tremor.  Outside records that were made available to me were reviewed.  Tremor: Yes.     How long has it been going on? States that she has always had "facial twitch" since early adulthood; has had hand tremor for 6 months, mostly on the L thumb and some of the L leg.  She states that she was under stress with parents recent death and recently placed on zoloft and was not sure if either contributed to her symptoms  At rest or with activation?  both  Fam hx of tremor?  Yes.  , father with Parkinsons Disease per records but pt states that it was never diagnosed but he had tremor and they thought that he had undiagnosed Parkinsons Disease and was on selegeline;  sister with ET  Located where?  L>R hand but some in the leg  Affected by caffeine: (doesn't drink enough caffeine to know)  Affected by alcohol: doesn't drink enough EtOH to know - 1 time q 6 months  Affected by stress:  Yes.    Affected by fatigue:  Yes.    Spills soup if on spoon:  No.  Spills glass of liquid if full:  No.  Affects ADL's (tying shoes, brushing teeth, etc):  No.  Tremor inducing meds:  Yes.  ; albuterol (only had to use with covid in 02-Aug-2023)  Other Specific Symptoms:  Voice: no changes Sleep: trouble getting and staying asleep  Vivid Dreams:  No.  Acting out dreams:  No. Wet Pillows: No. Postural symptoms:  No.  Falls?  No., none in the last 2 years Bradykinesia symptoms: no bradykinesia noted Loss of smell:  No. Loss of taste:  No. Urinary Incontinence:  No. Difficulty Swallowing:  No. Handwriting, micrographia: Yes.   Depression:  No., was having panic/anxiety before sertraline (before her mom died) Memory changes:  No. Hallucinations:  No.  visual distortions: No. N/V:  No. Lightheaded:  No.  Syncope: No. Diplopia:  No. Dyskinesia:  No.  Neuroimaging of the brfain has not previously been performed in the recent years.  She states that she had one done about 7-8 years ago at [facility] (ocular CT).  ALLERGIES:  No Known Allergies  CURRENT MEDICATIONS:  Current Outpatient Medications  Medication Instructions  . albuterol (VENTOLIN HFA) 108 (90 Base) MCG/ACT inhaler 2 puffs, Inhalation, Every 6 hours PRN  . fluticasone (CUTIVATE) 0.05 % cream 1 application, Topical, As needed  . Multiple Vitamin (MULTIVITAMIN) tablet 1 tablet, Oral, Daily  . Omega-3 Fatty Acids (FISH OIL) 1000 MG CAPS 1 capsule, Oral, Daily  . Probiotic Product (PROBIOTIC-10 PO) 1 tablet, Oral, Daily  . Red Yeast Rice Extract (RED YEAST RICE PO) 1 tablet, Oral, Daily  . sertraline (ZOLOFT) 50 mg, Oral, Daily  . Vitamin D-3 2,000 Units, Oral, Daily    Objective:   PHYSICAL  EXAMINATION:    VITALS:   Vitals:   12/11/19 0835  BP: 122/68  Pulse: 78  SpO2: 97%  Weight: 138 lb (62.6 kg)  Height: 5' 4.5" (1.638 m)    GEN:  The patient appears stated age and is in NAD.  Appears anxious HEENT:  Normocephalic, atraumatic.  The mucous membranes are moist. The superficial temporal arteries are without ropiness or tenderness. CV:  RRR Lungs:  CTAB Neck/HEME:  There are no carotid bruits bilaterally.  Neurological examination:  Orientation: The patient is alert and oriented x3.  Cranial nerves: There is good facial symmetry.  Extraocular muscles are intact. The visual fields are full to confrontational testing. The speech is fluent and clear. Soft palate rises symmetrically and there is no tongue deviation. Hearing is intact to conversational tone. Sensation: Sensation is intact to light touch throughout (facial, trunk, extremities). Vibration is intact at the bilateral big toe. There is no extinction with double simultaneous stimulation.  Motor: Strength is 5/5 in the bilateral upper and lower extremities.   Shoulder shrug is equal and symmetric.  There is no pronator drift. Deep tendon reflexes: Deep tendon reflexes are 2/4 at the bilateral biceps, triceps, brachioradialis, patella and achilles. Plantar responses are downgoing bilaterally.  Movement examination: Tone: There is normal tone in the bilateral upper extremities.  The tone in the lower extremities is normal.  Abnormal movements: no rest tremor even with distraction procedures.  No intention tremor.  There is no tremor of the outstretched hands.  There is some tremor of the left hand when the hand is held straight out, halfway between supination and pronation.  It is better when given a weight.  Mild tremor with Archimedes spirals on the left.  Able to pour water from one glass to another without spilling it. Coordination:  There is no decremation with RAM's, with any form of RAMS, including alternating  supination and pronation of the forearm, hand opening and closing, finger taps, heel taps and toe taps. Gait and Station: The patient has no difficulty arising out of a deep-seated chair without the use of the hands. The patient's stride length is good, with no reemergent tremor in either hand.  She is able to ambulate in a tandem fashion.  I have reviewed and interpreted the following labs independently   Chemistry      Component Value Date/Time   NA 140 06/14/2019 0951   K 4.1 06/14/2019 0951   CL 106 06/14/2019 0951   CO2 29 06/14/2019 0951   BUN 13 06/14/2019 0951   CREATININE 0.52 06/14/2019 0951      Component Value Date/Time   CALCIUM 8.7 06/14/2019 0951   ALKPHOS 51 11/29/2013 1436   AST 16 06/14/2019 0951   ALT 22 06/14/2019 0951   BILITOT 0.8 11/29/2013 1436     Lab Results  Component Value Date  TSH 1.49 06/14/2019     Total time spent on today's visit was 45 minutes, including both face-to-face time and nonface-to-face time.  Time included that spent on review of records (prior notes available to me/labs/imaging if pertinent), discussing treatment and goals, answering patient's questions and coordinating care.  Cc:  Ronnald Nian, DO

## 2019-12-11 ENCOUNTER — Encounter: Payer: Self-pay | Admitting: Neurology

## 2019-12-11 ENCOUNTER — Other Ambulatory Visit: Payer: Self-pay

## 2019-12-11 ENCOUNTER — Ambulatory Visit (INDEPENDENT_AMBULATORY_CARE_PROVIDER_SITE_OTHER): Payer: Medicare Other | Admitting: Neurology

## 2019-12-11 VITALS — BP 122/68 | HR 78 | Ht 64.5 in | Wt 138.0 lb

## 2019-12-11 DIAGNOSIS — R251 Tremor, unspecified: Secondary | ICD-10-CM | POA: Diagnosis not present

## 2019-12-11 NOTE — Patient Instructions (Signed)
It was my pleasure to see you today!  You do not have Parkinsons Disease today.  The physicians and staff at [facility] are committed to providing excellent care. You may receive a survey requesting feedback about your experience at our office. We strive to receive "very good" responses to the survey questions. If you feel that your experience would prevent you from giving the office a "very good " response, please contact our office to try to remedy the situation. We may be reached at [phone]. Thank you for taking the time out of your busy day to complete the survey.

## 2020-05-29 ENCOUNTER — Ambulatory Visit (INDEPENDENT_AMBULATORY_CARE_PROVIDER_SITE_OTHER): Payer: Medicare Other | Admitting: Family Medicine

## 2020-05-29 ENCOUNTER — Encounter: Payer: Self-pay | Admitting: Family Medicine

## 2020-05-29 VITALS — BP 118/80 | HR 68 | Temp 97.2°F | Ht 64.5 in | Wt 140.6 lb

## 2020-05-29 DIAGNOSIS — F418 Other specified anxiety disorders: Secondary | ICD-10-CM

## 2020-05-29 DIAGNOSIS — K1370 Unspecified lesions of oral mucosa: Secondary | ICD-10-CM | POA: Diagnosis not present

## 2020-05-29 DIAGNOSIS — Z23 Encounter for immunization: Secondary | ICD-10-CM

## 2020-05-29 DIAGNOSIS — F4321 Adjustment disorder with depressed mood: Secondary | ICD-10-CM

## 2020-05-29 MED ORDER — TRIAMCINOLONE ACETONIDE 0.1 % MT PSTE: 1.0000 "application " | PASTE | Freq: Two times a day (BID) | OROMUCOSAL | 1 refills | Status: DC

## 2020-05-29 MED ORDER — SERTRALINE HCL 50 MG PO TABS: 50.0000 mg | ORAL_TABLET | Freq: Every day | ORAL | 3 refills | Status: AC

## 2020-05-29 NOTE — Progress Notes (Signed)
Tami Warner is a 64 y.o. female  Chief Complaint  Patient presents with  . Follow-up    f/u depresion/meds, wants flu shot today.    HPI: Tami Warner is a 64 y.o. female who is seen today to f/u on depression. She is taking zoloft 50mg  daily. She states she feels she is calmer and not as bothered by things that would have previously bothered her. She denies side effects and "does not feel drugged". 57yo brother diagnosed with likely renal adenocarcinoma.   She would like flu vaccine today.   Past Medical History:  Diagnosis Date  . Hyperlipidemia   . Inflammatory polyps of colon without complications (HCC)   . Osteopenia   . Peripheral neuropathy   . Psoriasis   . Restless leg syndrome   . Tremor     Past Surgical History:  Procedure Laterality Date  . CHOLECYSTECTOMY    . CYST EXCISION    . TONSILLECTOMY AND ADENOIDECTOMY      Social History   Socioeconomic History  . Marital status: Single    Spouse name: Not on file  . Number of children: 3  . Years of education: Not on file  . Highest education level: Master's degree (e.g., MA, MS, MEng, MEd, MSW, MBA)  Occupational History  . Occupation: toxicologist    Comment: [employer]  Tobacco Use  . Smoking status: Never Smoker  . Smokeless tobacco: Never Used  Vaping Use  . Vaping Use: Never used  Substance and Sexual Activity  . Alcohol use: Yes    Comment: 1 time q 6 months  . Drug use: No  . Sexual activity: Not on file  Other Topics Concern  . Not on file  Social History Narrative   Right handed   One story appt on second floor    Lives alone    Social Determinants of Health   Financial Resource Strain: Low Risk   . Difficulty of Paying Living Expenses: Not hard at all  Food Insecurity: No Food Insecurity  . Worried About in the Last Year: Never true  . Ran Out of Food in the Last Year: Never true  Transportation Needs: No Transportation Needs  . Lack of Transportation  (Medical): No  . Lack of Transportation (Non-Medical): No  Physical Activity:   . Days of Exercise per Week: Not on file  . Minutes of Exercise per Session: Not on file  Stress:   . Feeling of Stress : Not on file  Social Connections:   . Frequency of Communication with Friends and Family: Not on file  . Frequency of Social Gatherings with Friends and Family: Not on file  . Attends Religious Services: Not on file  . Active Member of Clubs or Organizations: Not on file  . Attends Programme researcher, broadcasting/film/video Meetings: Not on file  . Marital Status: Not on file  Intimate Partner Violence:   . Fear of Current or Ex-Partner: Not on file  . Emotionally Abused: Not on file  . Physically Abused: Not on file  . Sexually Abused: Not on file    Family History  Problem Relation Age of Onset  . Hyperlipidemia Mother   . Hypertension Mother   . Arthritis Mother   . Gout Mother   . Hyperlipidemia Father   . Hypertension Father   . Tremor Father   . COPD Sister   . Asthma Sister   . Hyperlipidemia Sister   . Tremor Sister   . Hyperlipidemia  Brother   . Alcohol abuse Son   . Drug abuse Son   . Breast cancer Maternal Aunt   . Hyperlipidemia Sister      Immunization History  Administered Date(s) Administered  . Fluad Quad(high Dose 65+) 05/17/2019  . Influenza-Unspecified 06/08/2012  . PFIZER SARS-COV-2 Vaccination 10/06/2019, 10/31/2019  . Pneumococcal Conjugate-13 05/17/2019  . Tdap 09/06/2008  . Zoster 12/22/2011    Outpatient Encounter Medications as of 05/29/2020  Medication Sig  . albuterol (VENTOLIN HFA) 108 (90 Base) MCG/ACT inhaler Inhale 2 puffs into the lungs every 6 (six) hours as needed for wheezing or shortness of breath.  . Cholecalciferol (VITAMIN D-3) 25 MCG (1000 UT) CAPS Take 2,000 Units by mouth daily.   . fluticasone (CUTIVATE) 0.05 % cream Apply 1 application topically as needed.   . Multiple Vitamin (MULTIVITAMIN) tablet Take 1 tablet by mouth daily.  . Omega-3  Fatty Acids (FISH OIL) 1000 MG CAPS Take 1 capsule by mouth daily.   . Probiotic Product (PROBIOTIC-10 PO) Take 1 tablet by mouth daily.   . Red Yeast Rice Extract (RED YEAST RICE PO) Take 1 tablet by mouth daily.  . sertraline (ZOLOFT) 50 MG tablet Take 1 tablet (50 mg total) by mouth daily.  . [DISCONTINUED] sertraline (ZOLOFT) 50 MG tablet Take 1 tablet (50 mg total) by mouth daily.   No facility-administered encounter medications on file as of 05/29/2020.     ROS: Pertinent positives and negatives noted in HPI. Remainder of ROS non-contributory    No Known Allergies  BP 118/80   Pulse 68   Temp (!) 97.2 F (36.2 C) (Temporal)   Ht 5' 4.5" (1.638 m)   Wt 140 lb 9.6 oz (63.8 kg)   SpO2 96%   BMI 23.76 kg/m   Physical Exam Constitutional:      General: She is not in acute distress.    Appearance: Normal appearance. She is not ill-appearing.  HENT:     Mouth/Throat:     Mouth: Oral lesions present.     Comments: Single ulcer inner bottom lip Pulmonary:     Effort: No respiratory distress.  Neurological:     Mental Status: She is alert and oriented to person, place, and time.  Psychiatric:        Mood and Affect: Mood normal.        Behavior: Behavior normal.      A/P:  1. Situational depression 2. Situational anxiety - controlled, pt feels effective and no side effects Refill: - sertraline (ZOLOFT) 50 MG tablet; Take 1 tablet (50 mg total) by mouth daily.  Dispense: 90 tablet; Refill: 3 - sleep is better, appetite is good - f/u PRN Discussed plan and reviewed medications with patient, including risks, benefits, and potential side effects. Pt expressed understand. All questions answered.  3. Lesion of buccal mucosa Rx: - triamcinolone (KENALOG) 0.1 % paste; Use as directed 1 application in the mouth or throat 2 (two) times daily.  Dispense: 5 g; Refill: 1 - pt to use 2wks then stop  This visit occurred during the SARS-CoV-2 public health emergency.  Safety  protocols were in place, including screening questions prior to the visit, additional usage of staff PPE, and extensive cleaning of exam room while observing appropriate contact time as indicated for disinfecting solutions.

## 2020-05-29 NOTE — Addendum Note (Signed)
Addended by: Rene Paci on: 05/29/2020 10:05 AM   Modules accepted: Orders

## 2020-09-01 ENCOUNTER — Other Ambulatory Visit: Payer: Self-pay | Admitting: Family Medicine

## 2020-09-01 DIAGNOSIS — Z1231 Encounter for screening mammogram for malignant neoplasm of breast: Secondary | ICD-10-CM

## 2020-09-16 DIAGNOSIS — L603 Nail dystrophy: Secondary | ICD-10-CM | POA: Diagnosis not present

## 2020-09-16 DIAGNOSIS — L814 Other melanin hyperpigmentation: Secondary | ICD-10-CM | POA: Diagnosis not present

## 2020-09-16 DIAGNOSIS — L218 Other seborrheic dermatitis: Secondary | ICD-10-CM | POA: Diagnosis not present

## 2020-09-16 DIAGNOSIS — L821 Other seborrheic keratosis: Secondary | ICD-10-CM | POA: Diagnosis not present

## 2020-10-14 ENCOUNTER — Ambulatory Visit
Admission: RE | Admit: 2020-10-14 | Discharge: 2020-10-14 | Disposition: A | Discharge status: Home / Self Care | Payer: Medicare Other | Source: Ambulatory Visit | Attending: Family Medicine | Admitting: Family Medicine

## 2020-10-14 ENCOUNTER — Other Ambulatory Visit: Payer: Self-pay

## 2020-10-14 DIAGNOSIS — Z1231 Encounter for screening mammogram for malignant neoplasm of breast: Secondary | ICD-10-CM

## 2020-10-28 ENCOUNTER — Other Ambulatory Visit: Payer: Self-pay

## 2020-10-28 ENCOUNTER — Ambulatory Visit (INDEPENDENT_AMBULATORY_CARE_PROVIDER_SITE_OTHER): Payer: Medicare Other

## 2020-10-28 VITALS — BP 134/68 | HR 71 | Temp 97.6°F | Resp 16 | Ht 64.5 in | Wt 140.8 lb

## 2020-10-28 DIAGNOSIS — Z0000 Encounter for general adult medical examination without abnormal findings: Secondary | ICD-10-CM | POA: Diagnosis not present

## 2020-10-28 DIAGNOSIS — Z23 Encounter for immunization: Secondary | ICD-10-CM | POA: Diagnosis not present

## 2020-10-28 NOTE — Patient Instructions (Signed)
Ms. Tami Warner , Thank you for taking time to come for your Medicare Wellness Visit. I appreciate your ongoing commitment to your health goals. Please review the following plan we discussed and let me know if I can assist you in the future.   Screening recommendations/referrals: Colonoscopy: Per our conversation, completed in 2015-Due-2025 Mammogram: Completed 10/14/2020-Due 10/14/2021 Bone Density: Completed 09/12/2019-Due 09/11/2021 Recommended yearly ophthalmology/optometry visit for glaucoma screening and checkup Recommended yearly dental visit for hygiene and checkup  Vaccinations: Influenza vaccine: Up to date Pneumococcal vaccine: Completed vaccines Tdap vaccine: Discuss with pharmacy Shingles vaccine: Discuss with pharmacy Covid-19:Completed vaccines  Advanced directives: Information given today  Conditions/risks identified: See problem list  Next appointment: Follow up in one year for your annual wellness visit 11/03/2021 @ 9:45   Preventive Care 65 Years and Older, Female Preventive care refers to lifestyle choices and visits with your health care provider that can promote health and wellness. What does preventive care include?  A yearly physical exam. This is also called an annual well check.  Dental exams once or twice a year.  Routine eye exams. Ask your health care provider how often you should have your eyes checked.  Personal lifestyle choices, including:  Daily care of your teeth and gums.  Regular physical activity.  Eating a healthy diet.  Avoiding tobacco and drug use.  Limiting alcohol use.  Practicing safe sex.  Taking low-dose aspirin every day.  Taking vitamin and mineral supplements as recommended by your health care provider. What happens during an annual well check? The services and screenings done by your health care provider during your annual well check will depend on your age, overall health, lifestyle risk factors, and family history of  disease. Counseling  Your health care provider may ask you questions about your:  Alcohol use.  Tobacco use.  Drug use.  Emotional well-being.  Home and relationship well-being.  Sexual activity.  Eating habits.  History of falls.  Memory and ability to understand (cognition).  Work and work Astronomer.  Reproductive health. Screening  You may have the following tests or measurements:  Height, weight, and BMI.  Blood pressure.  Lipid and cholesterol levels. These may be checked every 5 years, or more frequently if you are over 95 years old.  Skin check.  Lung cancer screening. You may have this screening every year starting at age 6 if you have a 30-pack-year history of smoking and currently smoke or have quit within the past 15 years.  Fecal occult blood test (FOBT) of the stool. You may have this test every year starting at age 56.  Flexible sigmoidoscopy or colonoscopy. You may have a sigmoidoscopy every 5 years or a colonoscopy every 10 years starting at age 43.  Hepatitis C blood test.  Hepatitis B blood test.  Sexually transmitted disease (STD) testing.  Diabetes screening. This is done by checking your blood sugar (glucose) after you have not eaten for a while (fasting). You may have this done every 1-3 years.  Bone density scan. This is done to screen for osteoporosis. You may have this done starting at age 8.  Mammogram. This may be done every 1-2 years. Talk to your health care provider about how often you should have regular mammograms. Talk with your health care provider about your test results, treatment options, and if necessary, the need for more tests. Vaccines  Your health care provider may recommend certain vaccines, such as:  Influenza vaccine. This is recommended every year.  Tetanus, diphtheria,  and acellular pertussis (Tdap, Td) vaccine. You may need a Td booster every 10 years.  Zoster vaccine. You may need this after age  72.  Pneumococcal 13-valent conjugate (PCV13) vaccine. One dose is recommended after age 48.  Pneumococcal polysaccharide (PPSV23) vaccine. One dose is recommended after age 49. Talk to your health care provider about which screenings and vaccines you need and how often you need them. This information is not intended to replace advice given to you by your health care provider. Make sure you discuss any questions you have with your health care provider. Document Released: 09/05/2015 Document Revised: 04/28/2016 Document Reviewed: 06/10/2015 Elsevier Interactive Patient Education  2017 [place] Prevention in the Home Falls can cause injuries. They can happen to people of all ages. There are many things you can do to make your home safe and to help prevent falls. What can I do on the outside of my home?  Regularly fix the edges of walkways and driveways and fix any cracks.  Remove anything that might make you trip as you walk through a door, such as a raised step or threshold.  Trim any bushes or trees on the path to your home.  Use bright outdoor lighting.  Clear any walking paths of anything that might make someone trip, such as rocks or tools.  Regularly check to see if handrails are loose or broken. Make sure that both sides of any steps have handrails.  Any raised decks and porches should have guardrails on the edges.  Have any leaves, snow, or ice cleared regularly.  Use sand or salt on walking paths during winter.  Clean up any spills in your garage right away. This includes oil or grease spills. What can I do in the bathroom?  Use night lights.  Install grab bars by the toilet and in the tub and shower. Do not use towel bars as grab bars.  Use non-skid mats or decals in the tub or shower.  If you need to sit down in the shower, use a plastic, non-slip stool.  Keep the floor dry. Clean up any water that spills on the floor as soon as it happens.  Remove  soap buildup in the tub or shower regularly.  Attach bath mats securely with double-sided non-slip rug tape.  Do not have throw rugs and other things on the floor that can make you trip. What can I do in the bedroom?  Use night lights.  Make sure that you have a light by your bed that is easy to reach.  Do not use any sheets or blankets that are too big for your bed. They should not hang down onto the floor.  Have a firm chair that has side arms. You can use this for support while you get dressed.  Do not have throw rugs and other things on the floor that can make you trip. What can I do in the kitchen?  Clean up any spills right away.  Avoid walking on wet floors.  Keep items that you use a lot in easy-to-reach places.  If you need to reach something above you, use a strong step stool that has a grab bar.  Keep electrical cords out of the way.  Do not use floor polish or wax that makes floors slippery. If you must use wax, use non-skid floor wax.  Do not have throw rugs and other things on the floor that can make you trip. What can I do with my  stairs?  Do not leave any items on the stairs.  Make sure that there are handrails on both sides of the stairs and use them. Fix handrails that are broken or loose. Make sure that handrails are as long as the stairways.  Check any carpeting to make sure that it is firmly attached to the stairs. Fix any carpet that is loose or worn.  Avoid having throw rugs at the top or bottom of the stairs. If you do have throw rugs, attach them to the floor with carpet tape.  Make sure that you have a light switch at the top of the stairs and the bottom of the stairs. If you do not have them, ask someone to add them for you. What else can I do to help prevent falls?  Wear shoes that:  Do not have high heels.  Have rubber bottoms.  Are comfortable and fit you well.  Are closed at the toe. Do not wear sandals.  If you use a  stepladder:  Make sure that it is fully opened. Do not climb a closed stepladder.  Make sure that both sides of the stepladder are locked into place.  Ask someone to hold it for you, if possible.  Clearly mark and make sure that you can see:  Any grab bars or handrails.  First and last steps.  Where the edge of each step is.  Use tools that help you move around (mobility aids) if they are needed. These include:  Canes.  Walkers.  Scooters.  Crutches.  Turn on the lights when you go into a dark area. Replace any light bulbs as soon as they burn out.  Set up your furniture so you have a clear path. Avoid moving your furniture around.  If any of your floors are uneven, fix them.  If there are any pets around you, be aware of where they are.  Review your medicines with your doctor. Some medicines can make you feel dizzy. This can increase your chance of falling. Ask your doctor what other things that you can do to help prevent falls. This information is not intended to replace advice given to you by your health care provider. Make sure you discuss any questions you have with your health care provider. Document Released: 06/05/2009 Document Revised: 01/15/2016 Document Reviewed: 09/13/2014 Elsevier Interactive Patient Education  2017 [employer].

## 2020-10-28 NOTE — Progress Notes (Signed)
Subjective:   Robie Oats is a 65 y.o. female who presents for Medicare Annual (Subsequent) preventive examination.  Review of Systems     Cardiac Risk Factors include: advanced age (>18men, >56 women);dyslipidemia;sedentary lifestyle     Objective:    Today's Vitals   10/28/20 0941  BP: 134/68  Pulse: 71  Resp: 16  Temp: 97.6 F (36.4 C)  TempSrc: Temporal  SpO2: 96%  Weight: 140 lb 12.8 oz (63.9 kg)  Height: 5' 4.5" (1.638 m)   Body mass index is 23.8 kg/m.  Advanced Directives 10/28/2020 12/11/2019 10/24/2019  Does Patient Have a Medical Advance Directive? Yes Yes No  Type of Estate agent of [place];Living will Healthcare Power of [place];Living will -  Copy of Healthcare Power of Attorney in Chart? No - copy requested - -  Would patient like information on creating a medical advance directive? - - No - Patient declined    Current Medications (verified) Outpatient Encounter Medications as of 10/28/2020  Medication Sig   albuterol (VENTOLIN HFA) 108 (90 Base) MCG/ACT inhaler Inhale 2 puffs into the lungs every 6 (six) hours as needed for wheezing or shortness of breath.   Cholecalciferol (VITAMIN D-3) 25 MCG (1000 UT) CAPS Take 2,000 Units by mouth daily.    fluticasone (CUTIVATE) 0.05 % cream Apply 1 application topically as needed.    Multiple Vitamin (MULTIVITAMIN) tablet Take 1 tablet by mouth daily.   Omega-3 Fatty Acids (FISH OIL) 1000 MG CAPS Take 1 capsule by mouth daily.    Probiotic Product (PROBIOTIC-10 PO) Take 1 tablet by mouth daily.    Red Yeast Rice Extract (RED YEAST RICE PO) Take 1 tablet by mouth daily.   sertraline (ZOLOFT) 50 MG tablet Take 1 tablet (50 mg total) by mouth daily.   triamcinolone (KENALOG) 0.1 % paste Use as directed 1 application in the mouth or throat 2 (two) times daily.   No facility-administered encounter medications on file as of 10/28/2020.    Allergies (verified) Patient has no  known allergies.   History: Past Medical History:  Diagnosis Date   Hyperlipidemia    Inflammatory polyps of colon without complications (HCC)    Osteopenia    Peripheral neuropathy    Psoriasis    Restless leg syndrome    Tremor    Past Surgical History:  Procedure Laterality Date   CHOLECYSTECTOMY     CYST EXCISION     TONSILLECTOMY AND ADENOIDECTOMY     Family History  Problem Relation Age of Onset   Hyperlipidemia Mother    Hypertension Mother    Arthritis Mother    Gout Mother    Hyperlipidemia Father    Hypertension Father    Tremor Father    COPD Sister    Asthma Sister    Hyperlipidemia Sister    Tremor Sister    Hyperlipidemia Brother    Alcohol abuse Son    Drug abuse Son    Breast cancer Maternal Aunt    Hyperlipidemia Sister    Social History   Socioeconomic History   Marital status: Divorced    Spouse name: Not on file   Number of children: 3   Years of education: Not on file   Highest education level: Master's degree (e.g., MA, MS, MEng, MEd, MSW, MBA)  Occupational History   Occupation: toxicologist    Comment: [employer]  Tobacco Use   Smoking status: Never Smoker   Smokeless tobacco: Never Used  Advertising account planner  Vaping Use: Never used  Substance and Sexual Activity   Alcohol use: Yes    Comment: 1 time q 6 months   Drug use: No   Sexual activity: Not on file  Other Topics Concern   Not on file  Social History Narrative   Right handed   One story appt on second floor    Lives alone    Social Determinants of Health   Financial Resource Strain: Low Risk    Difficulty of Paying Living Expenses: Not hard at all  Food Insecurity: No Food Insecurity   Worried About Programme researcher, broadcasting/film/video in the Last Year: Never true   Ran Out of Food in the Last Year: Never true  Transportation Needs: No Transportation Needs   Lack of Transportation (Medical): No   Lack of Transportation (Non-Medical): No  Physical  Activity: Inactive   Days of Exercise per Week: 0 days   Minutes of Exercise per Session: 0 min  Stress: No Stress Concern Present   Feeling of Stress : Not at all  Social Connections: Socially Isolated   Frequency of Communication with Friends and Family: More than three times a week   Frequency of Social Gatherings with Friends and Family: More than three times a week   Attends Religious Services: Never   Database administrator or Organizations: No   Attends Engineer, structural: Never   Marital Status: Divorced    Tobacco Counseling Counseling given: Not Answered   Clinical Intake:  Pre-visit preparation completed: Yes  Pain : No/denies pain     Nutritional Status: BMI of 19-24  Normal Nutritional Risks: None Diabetes: No  How often do you need to have someone help you when you read instructions, pamphlets, or other written materials from your doctor or pharmacy?: 1 - Never  Diabetic?No  Interpreter Needed?: No  Information entered by :: [employer]   Activities of Daily Living In your present state of health, do you have any difficulty performing the following activities: 10/28/2020  Hearing? N  Vision? N  Difficulty concentrating or making decisions? Y  Comment occasionally  Walking or climbing stairs? N  Dressing or bathing? N  Doing errands, shopping? N  Preparing Food and eating ? N  Using the Toilet? N  In the past six months, have you accidently leaked urine? N  Do you have problems with loss of bowel control? N  Managing your Medications? N  Managing your Finances? N  Housekeeping or managing your Housekeeping? N  Some recent data might be hidden    Patient Care Team: Overton Mam, DO as PCP - General (Family Medicine)  Indicate any recent Medical Services you may have received from other than [facility] providers in the past year (date may be approximate).     Assessment:   This is a routine wellness examination for  Lilo.  Hearing/Vision screen  Hearing Screening   125Hz  250Hz  500Hz  1000Hz  2000Hz  3000Hz  4000Hz  6000Hz  8000Hz   Right ear:           Left ear:           Comments: No issues  Vision Screening Comments: Wears glasses Last eye exam-10/27/2020-Dr.York  Dietary issues and exercise activities discussed: Current Exercise Habits: The patient does not participate in regular exercise at present, Exercise limited by: None identified  Goals     Increase physical activity     Patient Stated     Eat healthier      Depression  Screen PHQ 2/9 Scores 10/28/2020 11/15/2019 10/24/2019 05/17/2019  PHQ - 2 Score 0 2 1 3   PHQ- 9 Score - 12 - 8    Fall Risk Fall Risk  10/28/2020 12/11/2019 10/24/2019  Falls in the past year? 0 0 0  Number falls in past yr: 0 0 0  Injury with Fall? 0 0 0  Risk for fall due to : - No Fall Risks -  Follow up Falls prevention discussed - Education provided;Falls prevention discussed    FALL RISK PREVENTION PERTAINING TO THE HOME:  Any stairs in or around the home? Yes  If so, are there any without handrails? No  Home free of loose throw rugs in walkways, pet beds, electrical cords, etc? Yes  Adequate lighting in your home to reduce risk of falls? Yes   ASSISTIVE DEVICES UTILIZED TO PREVENT FALLS:  Life alert? No  Use of a cane, walker or w/c? No  Grab bars in the bathroom? Yes  Shower chair or bench in shower? No  Elevated toilet seat or a handicapped toilet? No   TIMED UP AND GO:  Was the test performed? Yes .  Length of time to ambulate 10 feet: 9 sec.   Gait steady and fast without use of assistive device  Cognitive Function:Normal cognitive status assessed by direct observation by this Nurse Health Advisor. No abnormalities found.          Immunizations Immunization History  Administered Date(s) Administered   Fluad Quad(high Dose 65+) 05/17/2019, 05/29/2020   Influenza-Unspecified 06/08/2012   PFIZER(Purple Top)SARS-COV-2 Vaccination  10/06/2019, 10/31/2019, 06/05/2020   Pneumococcal Conjugate-13 05/17/2019   Pneumococcal Polysaccharide-23 10/28/2020   Tdap 09/06/2008   Zoster 12/22/2011    TDAP status: Due, Education has been provided regarding the importance of this vaccine. Advised may receive this vaccine at local pharmacy or Health Dept. Aware to provide a copy of the vaccination record if obtained from local pharmacy or Health Dept. Verbalized acceptance and understanding.  Flu Vaccine status: Up to date  Pneumococcal vaccine status: Completed during today's visit.  Covid-19 vaccine status: Completed vaccines  Qualifies for Shingles Vaccine? Yes   Zostavax completed Yes   Shingrix Completed?: No.    Education has been provided regarding the importance of this vaccine. Patient has been advised to call insurance company to determine out of pocket expense if they have not yet received this vaccine. Advised may also receive vaccine at local pharmacy or Health Dept. Verbalized acceptance and understanding.  Screening Tests Health Maintenance  Topic Date Due   Hepatitis C Screening  Never done   COLONOSCOPY (Pts 45-7060yrs Insurance coverage will need to be confirmed)  Never done   TETANUS/TDAP  09/06/2018   MAMMOGRAM  10/14/2022   INFLUENZA VACCINE  Completed   DEXA SCAN  Completed   COVID-19 Vaccine  Completed   PNA vac Low Risk Adult  Completed   HPV VACCINES  Aged Out    Health Maintenance  Health Maintenance Due  Topic Date Due   Hepatitis C Screening  Never done   COLONOSCOPY (Pts 45-3660yrs Insurance coverage will need to be confirmed)  Never done   TETANUS/TDAP  09/06/2018    Colorectal cancer screening: Type of screening: Colonoscopy. Completed 2015-per patient. Repeat every 10 years  Mammogram status: Completed Bilateral 10/14/2020. Repeat every year  Bone Density status: Completed 09/12/2019. Results reflect: Bone density results: OSTEOPENIA. Repeat every 2 years.  Lung Cancer  Screening: (Low Dose CT Chest recommended if Age 62-80 years, 30 pack-year  currently smoking OR have quit w/in 15years.) does not qualify.    Additional Screening:  Hepatitis C Screening: does qualify; Discuss with PCP  Vision Screening: Recommended annual ophthalmology exams for early detection of glaucoma and other disorders of the eye. Is the patient up to date with their annual eye exam?  Yes  Who is the provider or what is the name of the office in which the patient attends annual eye exams? Dr. Elyn Peers  Dental Screening: Recommended annual dental exams for proper oral hygiene  Community Resource Referral / Chronic Care Management: CRR required this visit?  No   CCM required this visit?  No      Plan:     I have personally reviewed and noted the following in the patients chart:    Medical and social history  Use of alcohol, tobacco or illicit drugs   Current medications and supplements  Functional ability and status  Nutritional status  Physical activity  Advanced directives  List of other physicians  Hospitalizations, surgeries, and ER visits in previous 12 months  Vitals  Screenings to include cognitive, depression, and falls  Referrals and appointments  In addition, I have reviewed and discussed with patient certain preventive protocols, quality metrics, and best practice recommendations. A written personalized care plan for preventive services as well as general preventive health recommendations were provided to patient.     Roanna Raider, LPN   02/28/2955  Nurse Health Advisor  Nurse Notes: None

## 2020-11-10 ENCOUNTER — Encounter: Payer: Self-pay | Admitting: Family Medicine

## 2020-11-10 ENCOUNTER — Other Ambulatory Visit: Payer: Self-pay

## 2020-11-10 ENCOUNTER — Ambulatory Visit (INDEPENDENT_AMBULATORY_CARE_PROVIDER_SITE_OTHER): Payer: Medicare Other | Admitting: Family Medicine

## 2020-11-10 VITALS — BP 136/80 | HR 76 | Temp 98.0°F | Ht 64.5 in | Wt 141.6 lb

## 2020-11-10 DIAGNOSIS — E782 Mixed hyperlipidemia: Secondary | ICD-10-CM

## 2020-11-10 DIAGNOSIS — I447 Left bundle-branch block, unspecified: Secondary | ICD-10-CM | POA: Diagnosis not present

## 2020-11-10 NOTE — Progress Notes (Signed)
[facility] [address] [place] [address] Dept: [phone] Dept Fax: [phone]  Acute Office Visit  Subjective:    Patient ID: Tami Warner, female    DOB: 05/29/1956, 65 y.o..   MRN: [id number]  Chief Complaint  Patient presents with  . Acute Visit    C/o having some fluttering x 1 week.      History of Present Illness:  Patient is in today complaining of a fluttering sensation in her chest over the past week. This has been almost continuous during that time. She denies any associated lightheadedness, dyspnea, N/V, chest/arm/neck pain. She did recently restart an exercise program, though this has been since the fluttering sensation started. She is getting 6-7,000 steps a day and doing some light hand weights and kettle bell exercises. She does not use tobacco. Her family history is positive for HTN and elevated cholesterol.  Past Medical History: Patient Active Problem List   Diagnosis Date Noted  . Pain of right great toe 06/25/2019  . UTI (urinary tract infection) 08/13/2014  . Hyperlipidemia   . Restless leg syndrome   . Psoriasis   . Osteopenia   . Peripheral neuropathy 10/28/2012   Past Surgical History:  Procedure Laterality Date  . CHOLECYSTECTOMY    . CYST EXCISION    . TONSILLECTOMY AND ADENOIDECTOMY     Family History  Problem Relation Age of Onset  . Hyperlipidemia Mother   . Hypertension Mother   . Arthritis Mother   . Gout Mother   . Hyperlipidemia Father   . Hypertension Father   . Tremor Father   . COPD Sister   . Asthma Sister   . Hyperlipidemia Sister   . Tremor Sister   . Hyperlipidemia Brother   . Alcohol abuse Son   . Drug abuse Son   . Breast cancer Maternal Aunt   . Hyperlipidemia Sister    Outpatient Medications Prior to Visit  Medication Sig Dispense Refill  . Cholecalciferol (VITAMIN D-3) 25 MCG (1000 UT) CAPS Take 2,000 Units by mouth daily.     .  fluticasone (CUTIVATE) 0.05 % cream Apply 1 application topically as needed.     . Multiple Vitamin (MULTIVITAMIN) tablet Take 1 tablet by mouth daily.    . Omega-3 Fatty Acids (FISH OIL) 1000 MG CAPS Take 1 capsule by mouth daily.     . Probiotic Product (PROBIOTIC-10 PO) Take 1 tablet by mouth daily.     . sertraline (ZOLOFT) 50 MG tablet Take 1 tablet (50 mg total) by mouth daily. 90 tablet 3  . albuterol (VENTOLIN HFA) 108 (90 Base) MCG/ACT inhaler Inhale 2 puffs into the lungs every 6 (six) hours as needed for wheezing or shortness of breath. (Patient not taking: Reported on 11/10/2020) 8 g 1  . Red Yeast Rice Extract (RED YEAST RICE PO) Take 1 tablet by mouth daily. (Patient not taking: Reported on 11/10/2020)    . triamcinolone (KENALOG) 0.1 % paste Use as directed 1 application in the mouth or throat 2 (two) times daily. (Patient not taking: Reported on 11/10/2020) 5 g 1   No facility-administered medications prior to visit.   No Known Allergies    Objective:   Today's Vitals   11/10/20 1333  BP: 136/80  Pulse: 76  Temp: 98 F (36.7 C)  TempSrc: Temporal  SpO2: 96%  Weight: 141 lb 9.6 oz (64.2 kg)  Height: 5' 4.5" (1.638 m)   Body mass index is 23.93  kg/m.   General: Well developed, well nourished. No acute distress. Lungs: Clear to auscultation bilaterally. CV: RRR with occasional irregular beats, but no murmurs or rubs. Pulses 2+ bilaterally. Extremities: No edema noted. Psych: Alert and oriented. Normal mood and affect.   Health Maintenance Due  Topic Date Due  . Hepatitis C Screening  Never done  . COLONOSCOPY (Pts 45-17yrs Insurance coverage will need to be confirmed)  Never done  . TETANUS/TDAP  09/06/2018    EKG  Sinus rhythm (rate= 73) with a left bundle branch block   Assessment & Plan:   1. New onset left bundle branch block (LBBB) Reviewed records. There are no prior EKGs for comparison. In light of flutter sensation and apparent new onset LBBB, I will  refer more urgently for cardiology assessment and potential echocardiogram. In the meantime, I recommended she start taking a daily 81 mg aspirin.  - EKG 12-Lead - Ambulatory referral to Cardiology  2. Mixed hyperlipidemia We will reassess lipid levels at this point.  - Lipid panel; Future   Loyola Mast, MD

## 2020-11-11 ENCOUNTER — Encounter: Payer: Self-pay | Admitting: Cardiology

## 2020-11-11 ENCOUNTER — Other Ambulatory Visit (INDEPENDENT_AMBULATORY_CARE_PROVIDER_SITE_OTHER): Payer: Medicare Other

## 2020-11-11 ENCOUNTER — Encounter: Payer: Self-pay | Admitting: *Deleted

## 2020-11-11 ENCOUNTER — Ambulatory Visit (INDEPENDENT_AMBULATORY_CARE_PROVIDER_SITE_OTHER): Payer: Medicare Other

## 2020-11-11 ENCOUNTER — Ambulatory Visit (INDEPENDENT_AMBULATORY_CARE_PROVIDER_SITE_OTHER): Payer: Medicare Other | Admitting: Cardiology

## 2020-11-11 VITALS — BP 142/78 | HR 80 | Ht 64.0 in | Wt 141.8 lb

## 2020-11-11 DIAGNOSIS — R002 Palpitations: Secondary | ICD-10-CM

## 2020-11-11 DIAGNOSIS — E782 Mixed hyperlipidemia: Secondary | ICD-10-CM | POA: Diagnosis not present

## 2020-11-11 DIAGNOSIS — I447 Left bundle-branch block, unspecified: Secondary | ICD-10-CM

## 2020-11-11 LAB — LIPID PANEL
Cholesterol: 280 mg/dL — ABNORMAL HIGH (ref 0–200)
HDL: 63.3 mg/dL (ref >39.00)
NonHDL: 216.52
Total CHOL/HDL Ratio: 4
Triglycerides: 300 mg/dL — ABNORMAL HIGH (ref 0.0–149.0)
VLDL: 60 mg/dL — ABNORMAL HIGH (ref 0.0–40.0)

## 2020-11-11 LAB — LDL CHOLESTEROL, DIRECT: Direct LDL: 145 mg/dL

## 2020-11-11 NOTE — Progress Notes (Signed)
Cardiology Office Note   Date:  11/11/2020   ID:  Tami Warner, DOB January 21, 1956, MRN [id number]  PCP:  Overton Mam, DO  Cardiologist:   No primary care provider on file. Referring:  Loyola Mast, MD  Chief Complaint  Patient presents with  . Palpitations      History of Present Illness: Tami Warner is a 65 y.o. female who presents for evaluation of a new left bundle branch block and palpitations.  The patient has had palpitations for a year or so.  However, in the last week they have been more frequent.  She is associated with caffeine her lack of sleep but now she is finding that happening with nothing going on.  She feels a flutter up in her throat.  It may go away with a little bit of coughing.  It lasts for minutes or seconds at a time.  Its daily.  She is not having any presyncope or syncope.  She is just started walking routinely and this does not make it work.  She denies any chest pressure, neck or arm discomfort.  She had no new shortness of breath, PND or orthopnea.  However, she is noted to have a left bundle branch block.  It was said to me know but I do not see an old one for comparison.  She has had no past cardiac history or work-up.  She has had a lot of stress.  She had 07-26-2023 COVID.  Her parents both died in Jul 26, 2023.  A sister died in July 26, 2023.  Her ex-husband died last week.   Past Medical History:  Diagnosis Date  . Hyperlipidemia   . Inflammatory polyps of colon without complications (HCC)   . Osteopenia   . Peripheral neuropathy   . Psoriasis   . Restless leg syndrome   . Tremor     Past Surgical History:  Procedure Laterality Date  . CHOLECYSTECTOMY    . CYST EXCISION    . TONSILLECTOMY AND ADENOIDECTOMY       Current Outpatient Medications  Medication Sig Dispense Refill  . albuterol (VENTOLIN HFA) 108 (90 Base) MCG/ACT inhaler Inhale 2 puffs into the lungs every 6 (six) hours as needed for wheezing or  shortness of breath. 8 g 1  . Cholecalciferol (VITAMIN D-3) 25 MCG (1000 UT) CAPS Take 2,000 Units by mouth daily.     . fluticasone (CUTIVATE) 0.05 % cream Apply 1 application topically as needed.     . Multiple Vitamin (MULTIVITAMIN) tablet Take 1 tablet by mouth daily.    . Omega-3 Fatty Acids (FISH OIL) 1000 MG CAPS Take 1 capsule by mouth daily.     . Probiotic Product (PROBIOTIC-10 PO) Take 1 tablet by mouth daily.     . sertraline (ZOLOFT) 50 MG tablet Take 1 tablet (50 mg total) by mouth daily. 90 tablet 3   No current facility-administered medications for this visit.    Allergies:   Patient has no known allergies.    Social History:  The patient  reports that she has never smoked. She has never used smokeless tobacco. She reports current alcohol use. She reports that she does not use drugs.   Family History:  The patient's family history includes Alcohol abuse in her son; Arthritis in her mother; Asthma in her sister; Breast cancer in her maternal aunt; COPD in her sister; Drug abuse in her son; Gout in her mother; Hyperlipidemia in her brother, father, mother, sister, and  sister; Hypertension in her father and mother; Tremor in her father and sister.    ROS:  Please see the history of present illness.   Otherwise, review of systems are positive for none.   All other systems are reviewed and negative.    PHYSICAL EXAM: VS:  BP (!) 142/78   Pulse 80   Ht 5\' 4"  (1.626 m)   Wt 141 lb 12.8 oz (64.3 kg)   SpO2 96%   BMI 24.34 kg/m  , BMI Body mass index is 24.34 kg/m. GENERAL:  Well appearing HEENT:  Pupils equal round and reactive, fundi not visualized, oral mucosa unremarkable NECK:  No jugular venous distention, waveform within normal limits, carotid upstroke brisk and symmetric, no bruits, no thyromegaly LYMPHATICS:  No cervical, inguinal adenopathy LUNGS:  Clear to auscultation bilaterally BACK:  No CVA tenderness CHEST:  Unremarkable HEART:  PMI not displaced or  sustained,S1 and S2 within normal limits, no S3, no S4, no clicks, no rubs, no murmurs ABD:  Flat, positive bowel sounds normal in frequency in pitch, no bruits, no rebound, no guarding, no midline pulsatile mass, no hepatomegaly, no splenomegaly EXT:  2 plus pulses throughout, no edema, no cyanosis no clubbing SKIN:  No rashes no nodules NEURO:  Cranial nerves II through XII grossly intact, motor grossly intact throughout PSYCH:  Cognitively intact, oriented to person place and time    EKG:  EKG is not ordered today. The ekg ordered yesterday demonstrates sinus rhythm, left bundle branch block, rate 73.  No old EKGs for comparison.   Recent Labs: No results found for requested labs within last 8760 hours.    Lipid Panel    Component Value Date/Time   CHOL 280 (H) 11/11/2020 1025   TRIG 300.0 (H) 11/11/2020 1025   HDL 63.30 11/11/2020 1025   CHOLHDL 4 11/11/2020 1025   VLDL 60.0 (H) 11/11/2020 1025   LDLDIRECT 145.0 11/11/2020 1025      Wt Readings from Last 3 Encounters:  11/11/20 141 lb 12.8 oz (64.3 kg)  11/10/20 141 lb 9.6 oz (64.2 kg)  10/28/20 140 lb 12.8 oz (63.9 kg)      Other studies Reviewed: Additional studies/ records that were reviewed today include: EKG, labs. Review of the above records demonstrates:  Please see elsewhere in the note.     ASSESSMENT AND PLAN:  PALPITATIONS:    I am going to apply a 3-day monitor.  She had normal thyroid in the fall.  Other electrolytes were normal.  Further management will be based on these results.  LBBB: I am going to look at her with an echocardiogram though I suspect she has a structurally normal heart.  Also because of her risk factors I will be checking a coronary calcium score although I do not think that her bundle branch block is related to any overt coronary artery disease I will start with this as a screening test.  This will also help guide therapies for her dyslipidemia.  DYSLIPIDEMIA: She had this done today  and I was able to give her the results.  Her triglycerides were 300.  LDL was 145.  Goals of therapy will be based on the calcium score.  We talked about plant-based Mediterranean diet.  Current medicines are reviewed at length with the patient today.  The patient does not have concerns regarding medicines.  The following changes have been made:  no change  Labs/ tests ordered today include:   Orders Placed This Encounter  Procedures  .  CT CARDIAC SCORING (SELF PAY ONLY)  . LONG TERM MONITOR (3-14 DAYS)  . ECHOCARDIOGRAM COMPLETE     Disposition:   FU with me based on the results of the above.     Signed, Rollene Rotunda, MD  11/11/2020 3:14 PM    [facility]

## 2020-11-11 NOTE — Progress Notes (Signed)
Per orders of Dr. Cirigliano pt is here for lab draw pt tolerated draw well. 

## 2020-11-11 NOTE — Patient Instructions (Addendum)
Medication Instructions:  The current medical regimen is effective;  continue present plan and medications as directed. Please refer to the Current Medication list given to you today.  *If you need a refill on your cardiac medications before your next appointment, please call your pharmacy*  Lab Work: NONE  Testing/Procedures: SEE ATTACHED DIRECTIONS FOR MONITOR  Echocardiogram - Your physician has requested that you have an echocardiogram. Echocardiography is a painless test that uses sound waves to create images of your heart. It provides your doctor with information about the size and shape of your heart and how well your heart's chambers and valves are working. This procedure takes approximately one hour. There are no restrictions for this procedure. This will be performed at our [facility] location - [address], [address].  CALCIUM SCORING   Follow-Up: Your next appointment:  AFTER MONITOR/ECHO (ABOUT2-3 WEEKS)  In Person with Rollene Rotunda, MD   At [facility], you and your health needs are our priority.  As part of our continuing mission to provide you with exceptional heart care, we have created designated Provider Care Teams.  These Care Teams include your primary Cardiologist (physician) and Advanced Practice Providers (APPs -  Physician Assistants and Nurse Practitioners) who all work together to provide you with the care you need, when you need it.  We recommend signing up for the patient portal called "MyChart".  Sign up information is provided on this After Visit Summary.  MyChart is used to connect with patients for Virtual Visits (Telemedicine).  Patients are able to view lab/test results, encounter notes, upcoming appointments, etc.  Non-urgent messages can be sent to your provider as well.   To learn more about what you can do with MyChart, go to [email or web address].     ZIO XT- Long Term Monitor Instructions   Your physician has requested you wear your ZIO  patch monitor_3_days.   This is a single patch monitor.  [employer] supplies one patch monitor per enrollment.  Additional stickers are not available.   Please do not apply patch if you will be having a Nuclear Stress Test, Echocardiogram, Cardiac CT, MRI, or Chest Xray during the time frame you would be wearing the monitor. The patch cannot be worn during these tests.  You cannot remove and re-apply the ZIO XT patch monitor.   Your ZIO patch monitor will be sent USPS Priority mail from [facility] directly to your home address. The monitor may also be mailed to a PO BOX if home delivery is not available.   It may take 3-5 days to receive your monitor after you have been enrolled.   Once you have received you monitor, please review enclosed instructions.  Your monitor has already been registered assigning a specific monitor serial # to you.   Applying the monitor   Shave hair from upper left chest.   Hold abrader disc by orange tab.  Rub abrader in 40 strokes over left upper chest as indicated in your monitor instructions.   Clean area with 4 enclosed alcohol pads .  Use all pads to assure are is cleaned thoroughly.  Let dry.   Apply patch as indicated in monitor instructions.  Patch will be place under collarbone on left side of chest with arrow pointing upward.   Rub patch adhesive wings for 2 minutes.Remove white label marked "1".  Remove white label marked "2".  Rub patch adhesive wings for 2 additional minutes.   While looking in a mirror, press and  release button in center of patch.  A small green light will flash 3-4 times .  This will be your only indicator the monitor has been turned on.     Do not shower for the first 24 hours.  You may shower after the first 24 hours.   Press button if you feel a symptom. You will hear a small click.  Record Date, Time and Symptom in the Patient Log Book.   When you are ready to remove patch, follow instructions on last 2 pages of Patient  Log Book.  Stick patch monitor onto last page of Patient Log Book.   Place Patient Log Book in [place] box.  Use locking tab on box and tape box closed securely.  The [employer] has [employer] on it.  Please place in mailbox as soon as possible.  Your physician should have your test results approximately 7 days after the monitor has been mailed back to [facility].   Call [facility] Customer Care at [phone] if you have questions regarding your ZIO XT patch monitor.  Call them immediately if you see an orange light blinking on your monitor.   If your monitor falls off in less than 4 days contact our Monitor department at [phone].  If your monitor becomes loose or falls off after 4 days call [employer] at [phone] for suggestions on securing your monitor.

## 2020-11-11 NOTE — Progress Notes (Signed)
Patient ID: Tami Warner, female   DOB: 1956/07/26, 65 y.o.   MRN: [id number] Patient enrolled for [employer] to ship a 3 day ZIO XT Long term holter monitor to her home.

## 2020-11-14 DIAGNOSIS — R002 Palpitations: Secondary | ICD-10-CM

## 2020-11-20 DIAGNOSIS — R002 Palpitations: Secondary | ICD-10-CM | POA: Diagnosis not present

## 2020-12-10 ENCOUNTER — Encounter (HOSPITAL_COMMUNITY): Payer: Self-pay | Admitting: Cardiology

## 2020-12-10 ENCOUNTER — Ambulatory Visit (HOSPITAL_COMMUNITY): Payer: Medicare Other | Attending: Cardiovascular Disease

## 2020-12-10 ENCOUNTER — Other Ambulatory Visit: Payer: Self-pay

## 2020-12-10 ENCOUNTER — Ambulatory Visit
Admission: RE | Admit: 2020-12-10 | Discharge: 2020-12-10 | Disposition: A | Discharge status: Home / Self Care | Payer: Self-pay | Source: Ambulatory Visit | Attending: Cardiology | Admitting: Cardiology

## 2020-12-10 DIAGNOSIS — I447 Left bundle-branch block, unspecified: Secondary | ICD-10-CM | POA: Diagnosis not present

## 2020-12-10 DIAGNOSIS — R002 Palpitations: Secondary | ICD-10-CM | POA: Insufficient documentation

## 2020-12-10 LAB — ECHOCARDIOGRAM COMPLETE
Area-P 1/2: 2.67 cm2
S' Lateral: 1.6 cm

## 2020-12-15 ENCOUNTER — Other Ambulatory Visit: Payer: Self-pay | Admitting: *Deleted

## 2020-12-15 DIAGNOSIS — R918 Other nonspecific abnormal finding of lung field: Secondary | ICD-10-CM

## 2020-12-18 DIAGNOSIS — R002 Palpitations: Secondary | ICD-10-CM | POA: Insufficient documentation

## 2020-12-18 DIAGNOSIS — I447 Left bundle-branch block, unspecified: Secondary | ICD-10-CM | POA: Insufficient documentation

## 2020-12-18 NOTE — Progress Notes (Signed)
Cardiology Office Note   Date:  12/19/2020   ID:  Tami Warner, DOB 10/07/1955, MRN [id number]  PCP:  Overton Mam, DO  Cardiologist:   No primary care provider on file. Referring:  Overton Mam, DO  Chief Complaint  Patient presents with  . Follow-up    2-3 weeks.  . Abnormal ECG      History of Present Illness: Tami Warner is a 65 y.o. female who presented for evaluation of a new left bundle branch block and palpitations.  She had no arrhythmias on monitor after the last visit.  She also had no coronary calcium and a normal echo.     Since I last saw her she has had no new cardiovascular events.  Her palpitations are not particularly problematic and seem to have resolved for the most part.  She certainly had no presyncope or syncope.  Past Medical History:  Diagnosis Date  . Hyperlipidemia   . Inflammatory polyps of colon without complications (HCC)   . Osteopenia   . Peripheral neuropathy   . Psoriasis   . Restless leg syndrome   . Tremor     Past Surgical History:  Procedure Laterality Date  . CHOLECYSTECTOMY    . CYST EXCISION    . TONSILLECTOMY AND ADENOIDECTOMY       Current Outpatient Medications  Medication Sig Dispense Refill  . albuterol (VENTOLIN HFA) 108 (90 Base) MCG/ACT inhaler Inhale 2 puffs into the lungs every 6 (six) hours as needed for wheezing or shortness of breath. 8 g 1  . Cholecalciferol (VITAMIN D-3) 25 MCG (1000 UT) CAPS Take 2,000 Units by mouth daily.     . fluticasone (CUTIVATE) 0.05 % cream Apply 1 application topically as needed.     . Multiple Vitamin (MULTIVITAMIN) tablet Take 1 tablet by mouth daily.    . Omega-3 Fatty Acids (FISH OIL) 1000 MG CAPS Take 1 capsule by mouth daily.     . Probiotic Product (PROBIOTIC-10 PO) Take 1 tablet by mouth daily.     . sertraline (ZOLOFT) 50 MG tablet Take 1 tablet (50 mg total) by mouth daily. 90 tablet 3   No current facility-administered  medications for this visit.    Allergies:   Patient has no known allergies.   ROS:  Please see the history of present illness.   Otherwise, review of systems are positive for none.   All other systems are reviewed and negative.    PHYSICAL EXAM: VS:  BP 122/60 (BP Location: Left Arm, Patient Position: Sitting, Cuff Size: Normal)   Pulse 72   Ht 5\' 4"  (1.626 m)   Wt 140 lb (63.5 kg)   BMI 24.03 kg/m  , BMI Body mass index is 24.03 kg/m. GENERAL:  Well appearing NECK:  No jugular venous distention, waveform within normal limits, carotid upstroke brisk and symmetric, no bruits, no thyromegaly LUNGS:  Clear to auscultation bilaterally CHEST:  Unremarkable HEART:  PMI not displaced or sustained,S1 and S2 within normal limits, no S3, no S4, no clicks, no rubs, no murmurs ABD:  Flat, positive bowel sounds normal in frequency in pitch, no bruits, no rebound, no guarding, no midline pulsatile mass, no hepatomegaly, no splenomegaly EXT:  2 plus pulses throughout, no edema, no cyanosis no clubbing   EKG:  EKG is not ordered today.   Recent Labs: No results found for requested labs within last 8760 hours.    Lipid Panel    Component Value  Date/Time   CHOL 280 (H) 11/11/2020 1025   TRIG 300.0 (H) 11/11/2020 1025   HDL 63.30 11/11/2020 1025   CHOLHDL 4 11/11/2020 1025   VLDL 60.0 (H) 11/11/2020 1025   LDLDIRECT 145.0 11/11/2020 1025      Wt Readings from Last 3 Encounters:  12/19/20 140 lb (63.5 kg)  11/11/20 141 lb 12.8 oz (64.3 kg)  11/10/20 141 lb 9.6 oz (64.2 kg)      Other studies Reviewed: Additional studies/ records that were reviewed today include: As above Review of the above records demonstrates:  Please see elsewhere in the note.     ASSESSMENT AND PLAN:  PALPITATIONS:   She had no arrhythmia on a monitor.  Since this is not problematic any longer no further work-up is planned.  LBBB:  She had no abnormalities on echo.  No further work-up  DYSLIPIDEMIA:   I  would suggest aggressive dietary management.  I suggested a plant-based diet and follow-up with her primary provider.   LUNG NODULES: These are likely benign and noted on her CT we will follow-up with a noncontrasted CT in 1 year.  Current medicines are reviewed at length with the patient today.  The patient does not have concerns regarding medicines.  The following changes have been made:  no change  Labs/ tests ordered today include:   No orders of the defined types were placed in this encounter.    Disposition:   FU with me as needed   Signed, Rollene Rotunda, MD  12/19/2020 12:37 PM    [facility]

## 2020-12-19 ENCOUNTER — Encounter: Payer: Self-pay | Admitting: Cardiology

## 2020-12-19 ENCOUNTER — Other Ambulatory Visit: Payer: Self-pay

## 2020-12-19 ENCOUNTER — Encounter: Payer: Self-pay | Admitting: *Deleted

## 2020-12-19 ENCOUNTER — Ambulatory Visit: Payer: Medicare Other | Admitting: Cardiology

## 2020-12-19 VITALS — BP 122/60 | HR 72 | Ht 64.0 in | Wt 140.0 lb

## 2020-12-19 DIAGNOSIS — E785 Hyperlipidemia, unspecified: Secondary | ICD-10-CM | POA: Diagnosis not present

## 2020-12-19 DIAGNOSIS — I447 Left bundle-branch block, unspecified: Secondary | ICD-10-CM | POA: Diagnosis not present

## 2020-12-19 DIAGNOSIS — R002 Palpitations: Secondary | ICD-10-CM | POA: Diagnosis not present

## 2020-12-19 NOTE — Patient Instructions (Signed)
Medication Instructions:  Continue current medications  *If you need a refill on your cardiac medications before your next appointment, please call your pharmacy*   Lab Work: None Ordered   Testing/Procedures: None Ordered   Follow-Up: At [facility], you and your health needs are our priority.  As part of our continuing mission to provide you with exceptional heart care, we have created designated Provider Care Teams.  These Care Teams include your primary Cardiologist (physician) and Advanced Practice Providers (APPs -  Physician Assistants and Nurse Practitioners) who all work together to provide you with the care you need, when you need it.  We recommend signing up for the patient portal called "MyChart".  Sign up information is provided on this After Visit Summary.  MyChart is used to connect with patients for Virtual Visits (Telemedicine).  Patients are able to view lab/test results, encounter notes, upcoming appointments, etc.  Non-urgent messages can be sent to your provider as well.   To learn more about what you can do with MyChart, go to [email or web address].    Your next appointment:   As Needed  

## 2020-12-19 NOTE — Progress Notes (Signed)
This encounter was created in error - please disregard.

## 2021-02-26 ENCOUNTER — Encounter: Payer: Self-pay | Admitting: Family Medicine

## 2021-02-26 NOTE — Telephone Encounter (Signed)
Pt aware.

## 2021-02-26 NOTE — Telephone Encounter (Signed)
Pt requested call back about this, please advise. [phone]

## 2021-03-27 ENCOUNTER — Telehealth: Payer: Self-pay

## 2021-03-27 NOTE — Telephone Encounter (Signed)
Recieveda refill request for Sertraline 50mg . She still had a refill left, called patient and was advised that [facility]. [facility] [place] and [facility]ist stated that it can be transferred and will get it sent to the other location to be filled. Dm/cma

## 2021-10-22 ENCOUNTER — Telehealth: Payer: Self-pay | Admitting: Cardiology

## 2021-10-22 NOTE — Telephone Encounter (Signed)
Reporting findings regarding CT Chest that was done. Please Advise  ?

## 2021-10-22 NOTE — Telephone Encounter (Signed)
Contacted radiology. Message placed in wrong chart.  ?

## 2021-11-03 ENCOUNTER — Ambulatory Visit: Payer: Medicare Other

## 2021-12-10 ENCOUNTER — Other Ambulatory Visit: Payer: Self-pay | Admitting: Cardiology

## 2021-12-10 DIAGNOSIS — R918 Other nonspecific abnormal finding of lung field: Secondary | ICD-10-CM

## 2021-12-14 ENCOUNTER — Other Ambulatory Visit: Payer: Medicare Other

## 2022-01-05 ENCOUNTER — Ambulatory Visit
Admission: RE | Admit: 2022-01-05 | Discharge: 2022-01-05 | Disposition: A | Discharge status: Home / Self Care | Payer: Medicare Other | Source: Ambulatory Visit | Attending: Cardiology | Admitting: Cardiology

## 2022-01-05 DIAGNOSIS — R918 Other nonspecific abnormal finding of lung field: Secondary | ICD-10-CM

## 2022-01-11 ENCOUNTER — Encounter: Payer: Self-pay | Admitting: *Deleted

## 2022-12-27 IMAGING — MG MM DIGITAL SCREENING BILAT W/ TOMO AND CAD
8 series · 8 of 24 positions shown · non-contrast
Comparison: Previous exam(s).

CLINICAL DATA: Screening.

EXAM:
DIGITAL SCREENING BILATERAL MAMMOGRAM WITH TOMOSYNTHESIS AND CAD
TECHNIQUE: Bilateral screening digital craniocaudal and mediolateral oblique
mammograms were obtained. Bilateral screening digital breast
tomosynthesis was performed. The images were evaluated with
computer-aided detection.

[L CC synth-2D]
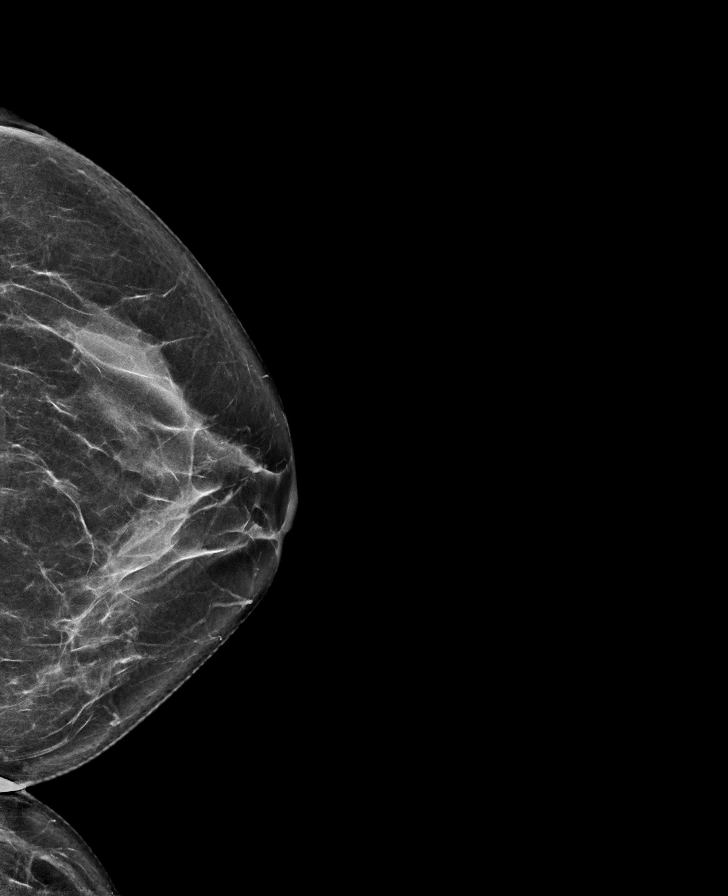

[R CC synth-2D]
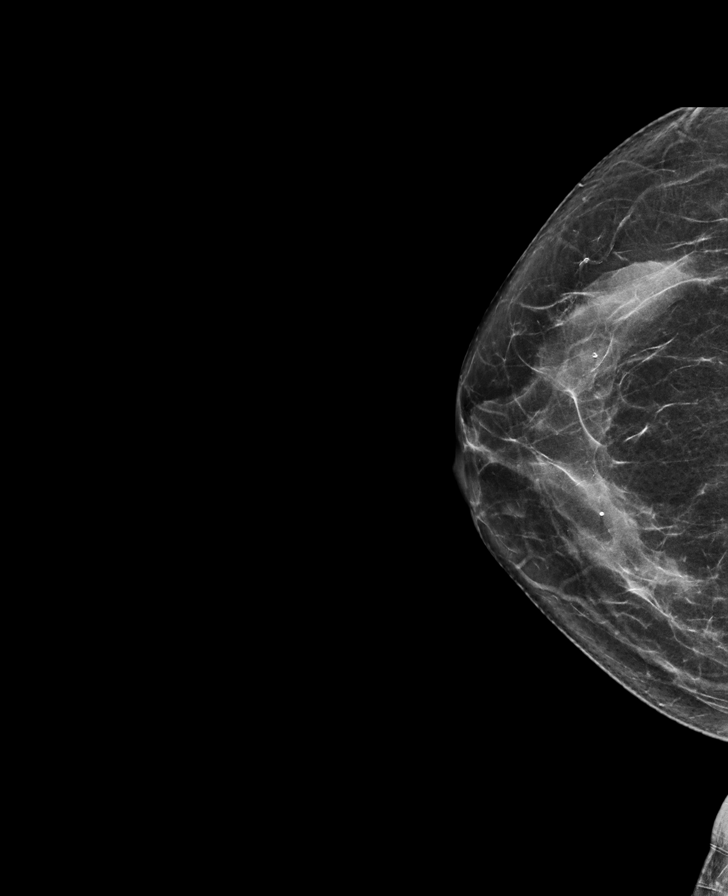

[R MLO synth-2D]
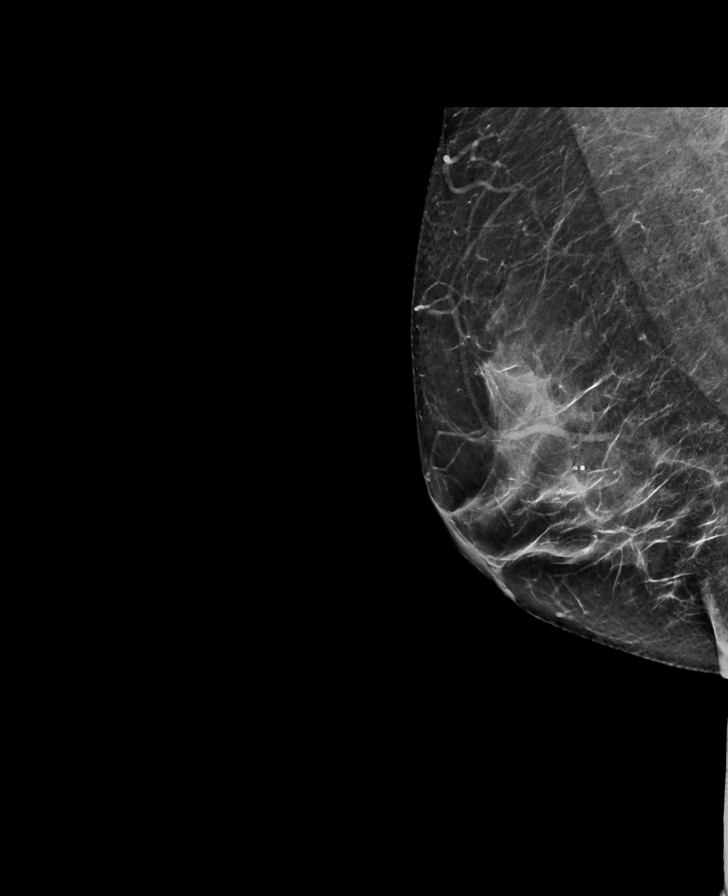

[L MLO synth-2D]
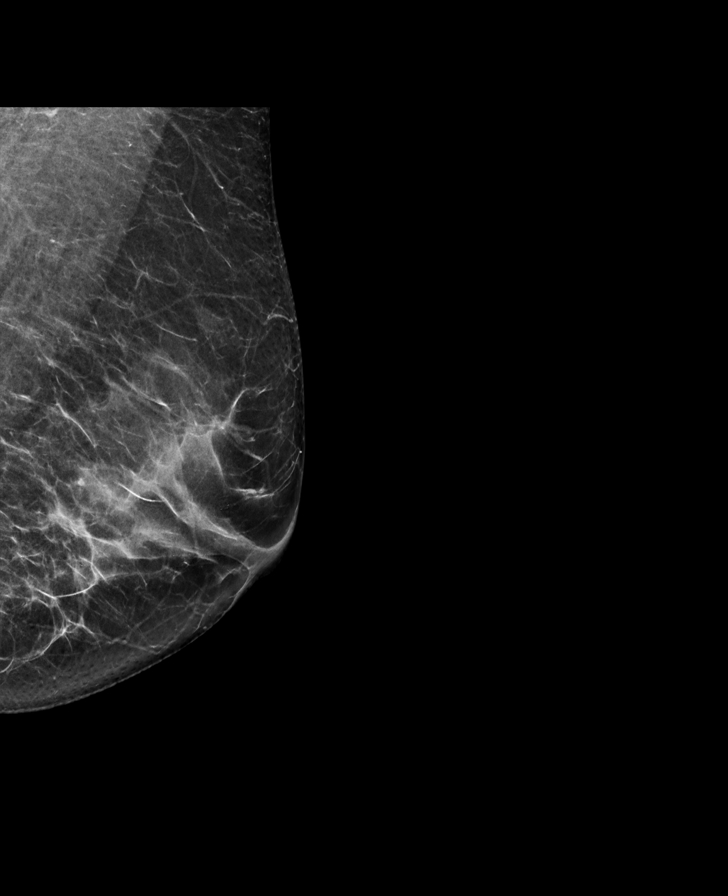

[L MLO tomo · tomo slice 36/71.0]
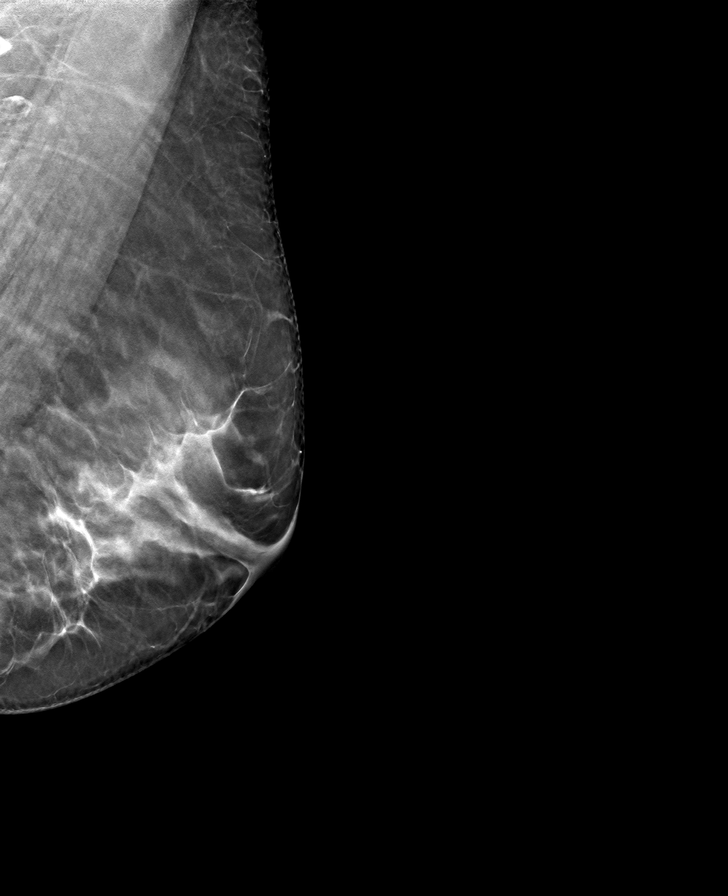

[R CC tomo · tomo slice 33/65.0]
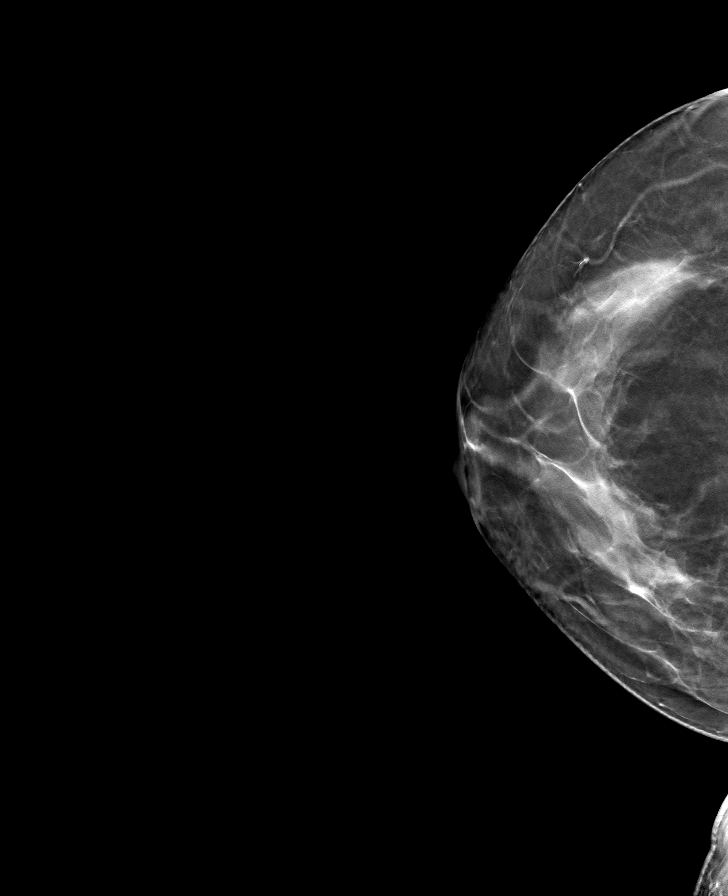

[R MLO tomo · tomo slice 35/69.0]
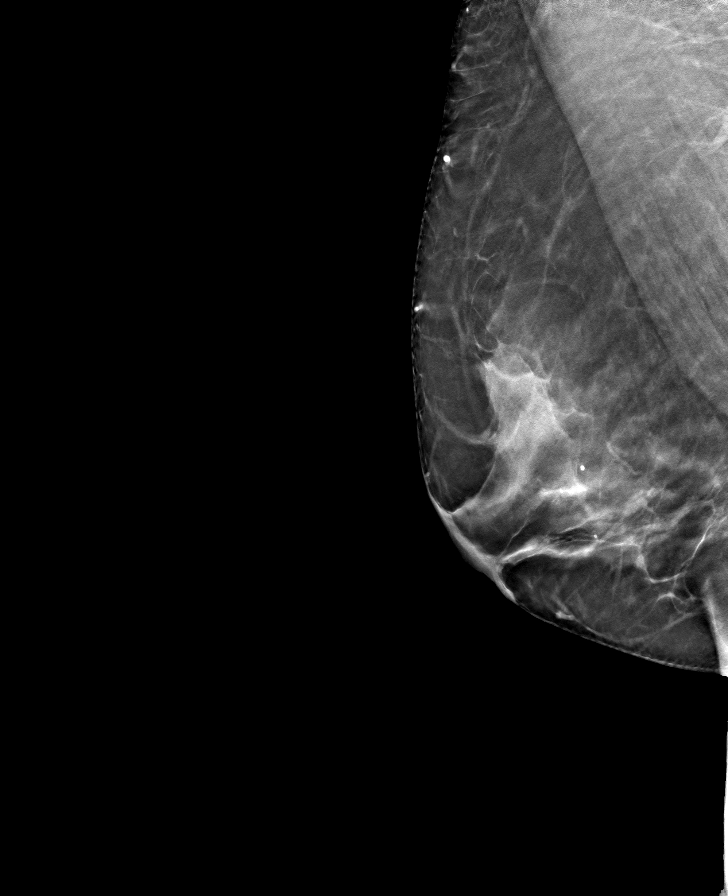

[L CC tomo · tomo slice 35/69.0]
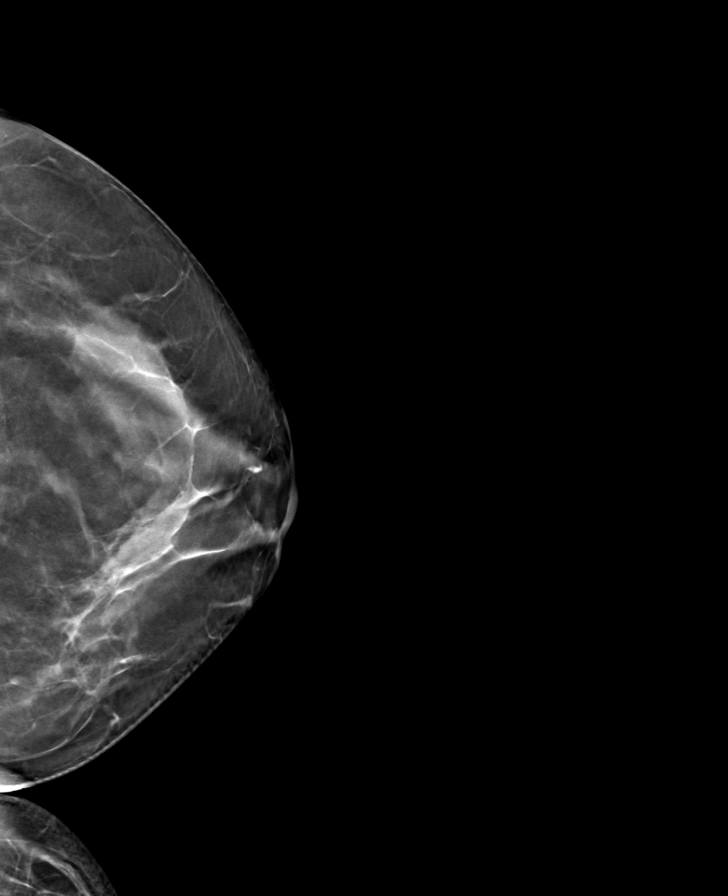

[8 of 24 positions shown; findings below may reference images not displayed]

ACR Breast Density Category c: The breast tissue is heterogeneously
dense, which may obscure small masses.
FINDINGS: There are no findings suspicious for malignancy.
IMPRESSION: No mammographic evidence of malignancy. A result letter of this
screening mammogram will be mailed directly to the patient.

RECOMMENDATION:
Screening mammogram in one year. (Code:Q3-W-BC3)

BI-RADS CATEGORY  1: Negative.
